# Patient Record
Sex: Male | Born: 1935 | Race: White | Hispanic: No | Marital: Married | Smoking: Former smoker
Health system: Southern US, Community
[De-identification: ages and names within clinical notes are randomized; demographics above are authoritative.]

## PROBLEM LIST (undated history)

## (undated) DIAGNOSIS — I509 Heart failure, unspecified: Secondary | ICD-10-CM

## (undated) DIAGNOSIS — I10 Essential (primary) hypertension: Secondary | ICD-10-CM

## (undated) DIAGNOSIS — I4891 Unspecified atrial fibrillation: Secondary | ICD-10-CM

## (undated) DIAGNOSIS — I714 Abdominal aortic aneurysm, without rupture, unspecified: Secondary | ICD-10-CM

## (undated) DIAGNOSIS — R0602 Shortness of breath: Secondary | ICD-10-CM

## (undated) DIAGNOSIS — M51379 Other intervertebral disc degeneration, lumbosacral region without mention of lumbar back pain or lower extremity pain: Secondary | ICD-10-CM

## (undated) DIAGNOSIS — E785 Hyperlipidemia, unspecified: Secondary | ICD-10-CM

## (undated) DIAGNOSIS — I5032 Chronic diastolic (congestive) heart failure: Secondary | ICD-10-CM

## (undated) DIAGNOSIS — N200 Calculus of kidney: Secondary | ICD-10-CM

## (undated) DIAGNOSIS — I447 Left bundle-branch block, unspecified: Secondary | ICD-10-CM

## (undated) DIAGNOSIS — J4489 Other specified chronic obstructive pulmonary disease: Secondary | ICD-10-CM

## (undated) DIAGNOSIS — E119 Type 2 diabetes mellitus without complications: Secondary | ICD-10-CM

## (undated) DIAGNOSIS — K219 Gastro-esophageal reflux disease without esophagitis: Secondary | ICD-10-CM

## (undated) DIAGNOSIS — M5137 Other intervertebral disc degeneration, lumbosacral region: Secondary | ICD-10-CM

## (undated) DIAGNOSIS — C801 Malignant (primary) neoplasm, unspecified: Secondary | ICD-10-CM

## (undated) DIAGNOSIS — Z85118 Personal history of other malignant neoplasm of bronchus and lung: Secondary | ICD-10-CM

## (undated) DIAGNOSIS — J449 Chronic obstructive pulmonary disease, unspecified: Secondary | ICD-10-CM

## (undated) DIAGNOSIS — I2510 Atherosclerotic heart disease of native coronary artery without angina pectoris: Secondary | ICD-10-CM

## (undated) DIAGNOSIS — Z95810 Presence of automatic (implantable) cardiac defibrillator: Secondary | ICD-10-CM

## (undated) DIAGNOSIS — I119 Hypertensive heart disease without heart failure: Secondary | ICD-10-CM

## (undated) HISTORY — PX: TONSILLECTOMY: SUR1361

## (undated) HISTORY — DX: Chronic obstructive pulmonary disease, unspecified: J44.9

## (undated) HISTORY — DX: Personal history of other malignant neoplasm of bronchus and lung: Z85.118

## (undated) HISTORY — DX: Malignant (primary) neoplasm, unspecified: C80.1

## (undated) HISTORY — PX: CATARACT EXTRACTION: SUR2

## (undated) HISTORY — DX: Gastro-esophageal reflux disease without esophagitis: K21.9

## (undated) HISTORY — DX: Hypertensive heart disease without heart failure: I11.9

## (undated) HISTORY — DX: Essential (primary) hypertension: I10

## (undated) HISTORY — DX: Type 2 diabetes mellitus without complications: E11.9

## (undated) HISTORY — DX: Other intervertebral disc degeneration, lumbosacral region: M51.37

## (undated) HISTORY — DX: Heart failure, unspecified: I50.9

## (undated) HISTORY — DX: Calculus of kidney: N20.0

## (undated) HISTORY — DX: Hyperlipidemia, unspecified: E78.5

## (undated) HISTORY — DX: Unspecified atrial fibrillation: I48.91

## (undated) HISTORY — DX: Abdominal aortic aneurysm, without rupture: I71.4

## (undated) HISTORY — DX: Shortness of breath: R06.02

## (undated) HISTORY — DX: Chronic diastolic (congestive) heart failure: I50.32

## (undated) HISTORY — DX: Atherosclerotic heart disease of native coronary artery without angina pectoris: I25.10

## (undated) HISTORY — DX: Abdominal aortic aneurysm, without rupture, unspecified: I71.40

## (undated) HISTORY — DX: Other specified chronic obstructive pulmonary disease: J44.89

## (undated) HISTORY — DX: Other intervertebral disc degeneration, lumbosacral region without mention of lumbar back pain or lower extremity pain: M51.379

---

## 2002-07-23 ENCOUNTER — Ambulatory Visit (HOSPITAL_COMMUNITY): Admission: RE | Admit: 2002-07-23 | Discharge: 2002-07-24 | Payer: Self-pay | Admitting: Cardiology

## 2005-11-12 DIAGNOSIS — C801 Malignant (primary) neoplasm, unspecified: Secondary | ICD-10-CM

## 2005-11-12 HISTORY — DX: Malignant (primary) neoplasm, unspecified: C80.1

## 2005-11-30 ENCOUNTER — Ambulatory Visit (HOSPITAL_COMMUNITY): Admission: RE | Admit: 2005-11-30 | Discharge: 2005-11-30 | Payer: Self-pay | Admitting: Optometry

## 2005-12-13 DIAGNOSIS — Z85118 Personal history of other malignant neoplasm of bronchus and lung: Secondary | ICD-10-CM

## 2005-12-13 HISTORY — DX: Personal history of other malignant neoplasm of bronchus and lung: Z85.118

## 2005-12-13 HISTORY — PX: OTHER SURGICAL HISTORY: SHX169

## 2005-12-20 ENCOUNTER — Encounter (INDEPENDENT_AMBULATORY_CARE_PROVIDER_SITE_OTHER): Payer: Self-pay | Admitting: Specialist

## 2005-12-20 ENCOUNTER — Inpatient Hospital Stay (HOSPITAL_COMMUNITY): Admission: RE | Admit: 2005-12-20 | Discharge: 2005-12-25 | Payer: Self-pay | Admitting: Thoracic Surgery

## 2006-01-01 ENCOUNTER — Encounter: Admission: RE | Admit: 2006-01-01 | Discharge: 2006-01-01 | Payer: Self-pay | Admitting: Thoracic Surgery

## 2006-01-15 ENCOUNTER — Encounter: Admission: RE | Admit: 2006-01-15 | Discharge: 2006-01-15 | Payer: Self-pay | Admitting: Thoracic Surgery

## 2006-04-02 ENCOUNTER — Ambulatory Visit: Payer: Self-pay | Admitting: Cardiology

## 2006-10-30 ENCOUNTER — Encounter: Admission: RE | Admit: 2006-10-30 | Discharge: 2006-10-30 | Payer: Self-pay | Admitting: Thoracic Surgery

## 2006-11-12 HISTORY — PX: COLONOSCOPY: SHX174

## 2007-03-12 ENCOUNTER — Ambulatory Visit: Payer: Self-pay | Admitting: Thoracic Surgery

## 2007-03-12 ENCOUNTER — Encounter: Admission: RE | Admit: 2007-03-12 | Discharge: 2007-03-12 | Payer: Self-pay | Admitting: Thoracic Surgery

## 2007-09-04 ENCOUNTER — Ambulatory Visit: Payer: Self-pay | Admitting: Thoracic Surgery

## 2007-12-24 ENCOUNTER — Encounter: Admission: RE | Admit: 2007-12-24 | Discharge: 2007-12-24 | Payer: Self-pay | Admitting: Thoracic Surgery

## 2007-12-24 ENCOUNTER — Ambulatory Visit: Payer: Self-pay | Admitting: Thoracic Surgery

## 2008-04-30 ENCOUNTER — Encounter: Admission: RE | Admit: 2008-04-30 | Discharge: 2008-04-30 | Payer: Self-pay | Admitting: Cardiology

## 2008-04-30 ENCOUNTER — Encounter: Payer: Self-pay | Admitting: Internal Medicine

## 2008-05-03 ENCOUNTER — Ambulatory Visit (HOSPITAL_COMMUNITY): Admission: RE | Admit: 2008-05-03 | Discharge: 2008-05-03 | Payer: Self-pay | Admitting: Cardiology

## 2008-05-11 ENCOUNTER — Ambulatory Visit: Payer: Self-pay | Admitting: Internal Medicine

## 2008-05-11 DIAGNOSIS — I359 Nonrheumatic aortic valve disorder, unspecified: Secondary | ICD-10-CM | POA: Insufficient documentation

## 2008-05-11 DIAGNOSIS — M5137 Other intervertebral disc degeneration, lumbosacral region: Secondary | ICD-10-CM

## 2008-05-11 DIAGNOSIS — J449 Chronic obstructive pulmonary disease, unspecified: Secondary | ICD-10-CM

## 2008-05-11 DIAGNOSIS — E785 Hyperlipidemia, unspecified: Secondary | ICD-10-CM

## 2008-05-11 DIAGNOSIS — R0602 Shortness of breath: Secondary | ICD-10-CM

## 2008-05-11 DIAGNOSIS — I119 Hypertensive heart disease without heart failure: Secondary | ICD-10-CM

## 2008-06-16 ENCOUNTER — Ambulatory Visit: Payer: Self-pay | Admitting: Internal Medicine

## 2008-06-25 ENCOUNTER — Ambulatory Visit: Payer: Self-pay | Admitting: Internal Medicine

## 2008-06-28 DIAGNOSIS — R05 Cough: Secondary | ICD-10-CM

## 2008-06-30 ENCOUNTER — Ambulatory Visit: Payer: Self-pay | Admitting: Thoracic Surgery

## 2008-06-30 ENCOUNTER — Ambulatory Visit: Payer: Self-pay | Admitting: Cardiology

## 2008-06-30 ENCOUNTER — Encounter: Admission: RE | Admit: 2008-06-30 | Discharge: 2008-06-30 | Payer: Self-pay | Admitting: Thoracic Surgery

## 2008-07-09 ENCOUNTER — Ambulatory Visit: Payer: Self-pay | Admitting: Internal Medicine

## 2008-07-09 LAB — CONVERTED CEMR LAB
Basophils Absolute: 0.1 10*3/uL (ref 0.0–0.1)
Basophils Relative: 1.4 % (ref 0.0–3.0)
CO2: 30 meq/L (ref 19–32)
Calcium: 9.6 mg/dL (ref 8.4–10.5)
Chloride: 102 meq/L (ref 96–112)
Eosinophils Relative: 3.4 % (ref 0.0–5.0)
GFR calc Af Amer: 70 mL/min
Glucose, Bld: 115 mg/dL — ABNORMAL HIGH (ref 70–99)
HCT: 44.4 % (ref 39.0–52.0)
Lymphocytes Relative: 29.5 % (ref 12.0–46.0)
Sodium: 139 meq/L (ref 135–145)
TSH: 2.61 microintl units/mL (ref 0.35–5.50)

## 2008-07-13 ENCOUNTER — Telehealth (INDEPENDENT_AMBULATORY_CARE_PROVIDER_SITE_OTHER): Payer: Self-pay | Admitting: *Deleted

## 2008-07-23 ENCOUNTER — Ambulatory Visit: Payer: Self-pay | Admitting: Internal Medicine

## 2008-07-29 ENCOUNTER — Encounter: Admission: RE | Admit: 2008-07-29 | Discharge: 2008-07-29 | Payer: Self-pay | Admitting: Otolaryngology

## 2008-07-29 ENCOUNTER — Encounter: Payer: Self-pay | Admitting: Gastroenterology

## 2008-08-06 ENCOUNTER — Ambulatory Visit: Payer: Self-pay | Admitting: Internal Medicine

## 2008-08-06 DIAGNOSIS — K219 Gastro-esophageal reflux disease without esophagitis: Secondary | ICD-10-CM

## 2008-08-06 HISTORY — DX: Gastro-esophageal reflux disease without esophagitis: K21.9

## 2008-08-11 ENCOUNTER — Telehealth: Payer: Self-pay | Admitting: Internal Medicine

## 2008-09-03 ENCOUNTER — Encounter: Payer: Self-pay | Admitting: Internal Medicine

## 2008-09-03 ENCOUNTER — Ambulatory Visit: Payer: Self-pay | Admitting: Gastroenterology

## 2008-09-03 ENCOUNTER — Ambulatory Visit (HOSPITAL_COMMUNITY): Admission: RE | Admit: 2008-09-03 | Discharge: 2008-09-03 | Payer: Self-pay | Admitting: Cardiology

## 2008-09-03 ENCOUNTER — Ambulatory Visit: Payer: Self-pay | Admitting: Internal Medicine

## 2008-09-07 ENCOUNTER — Ambulatory Visit: Payer: Self-pay | Admitting: Gastroenterology

## 2008-09-10 ENCOUNTER — Telehealth (INDEPENDENT_AMBULATORY_CARE_PROVIDER_SITE_OTHER): Payer: Self-pay | Admitting: *Deleted

## 2008-09-10 ENCOUNTER — Encounter: Payer: Self-pay | Admitting: Internal Medicine

## 2008-09-15 ENCOUNTER — Ambulatory Visit: Payer: Self-pay | Admitting: Internal Medicine

## 2008-09-30 ENCOUNTER — Ambulatory Visit: Payer: Self-pay | Admitting: Internal Medicine

## 2008-11-08 ENCOUNTER — Ambulatory Visit: Payer: Self-pay | Admitting: Internal Medicine

## 2008-11-30 ENCOUNTER — Encounter: Payer: Self-pay | Admitting: Internal Medicine

## 2009-01-05 ENCOUNTER — Ambulatory Visit: Payer: Self-pay | Admitting: Thoracic Surgery

## 2009-01-05 ENCOUNTER — Encounter: Admission: RE | Admit: 2009-01-05 | Discharge: 2009-01-05 | Payer: Self-pay | Admitting: Thoracic Surgery

## 2009-03-08 ENCOUNTER — Ambulatory Visit: Payer: Self-pay | Admitting: Internal Medicine

## 2009-05-03 DIAGNOSIS — N200 Calculus of kidney: Secondary | ICD-10-CM

## 2009-05-03 HISTORY — DX: Calculus of kidney: N20.0

## 2009-05-27 ENCOUNTER — Ambulatory Visit: Payer: Self-pay | Admitting: Vascular Surgery

## 2009-09-05 ENCOUNTER — Ambulatory Visit: Payer: Self-pay | Admitting: Internal Medicine

## 2009-12-07 ENCOUNTER — Ambulatory Visit: Payer: Self-pay | Admitting: Thoracic Surgery

## 2009-12-07 ENCOUNTER — Encounter: Admission: RE | Admit: 2009-12-07 | Discharge: 2009-12-07 | Payer: Self-pay | Admitting: Thoracic Surgery

## 2010-02-08 ENCOUNTER — Encounter: Admission: RE | Admit: 2010-02-08 | Discharge: 2010-02-08 | Payer: Self-pay | Admitting: Cardiology

## 2010-02-10 HISTORY — PX: TISSUE AORTIC VALVE REPLACEMENT: SHX2527

## 2010-02-10 HISTORY — PX: CORONARY ARTERY BYPASS GRAFT: SHX141

## 2010-02-16 ENCOUNTER — Encounter: Payer: Self-pay | Admitting: Internal Medicine

## 2010-02-16 ENCOUNTER — Ambulatory Visit: Payer: Self-pay | Admitting: Thoracic Surgery (Cardiothoracic Vascular Surgery)

## 2010-02-16 ENCOUNTER — Ambulatory Visit (HOSPITAL_COMMUNITY): Admission: RE | Admit: 2010-02-16 | Discharge: 2010-02-16 | Payer: Self-pay | Admitting: Cardiology

## 2010-02-20 DIAGNOSIS — Z952 Presence of prosthetic heart valve: Secondary | ICD-10-CM | POA: Insufficient documentation

## 2010-02-21 ENCOUNTER — Encounter: Admission: AD | Admit: 2010-02-21 | Discharge: 2010-02-21 | Payer: Self-pay | Admitting: Dentistry

## 2010-02-22 ENCOUNTER — Encounter: Payer: Self-pay | Admitting: Thoracic Surgery (Cardiothoracic Vascular Surgery)

## 2010-02-23 ENCOUNTER — Observation Stay (HOSPITAL_COMMUNITY): Admission: RE | Admit: 2010-02-23 | Discharge: 2010-02-24 | Payer: Self-pay | Admitting: Dentistry

## 2010-02-23 ENCOUNTER — Ambulatory Visit: Payer: Self-pay | Admitting: Dentistry

## 2010-02-28 ENCOUNTER — Encounter: Payer: Self-pay | Admitting: Thoracic Surgery (Cardiothoracic Vascular Surgery)

## 2010-02-28 ENCOUNTER — Inpatient Hospital Stay (HOSPITAL_COMMUNITY)
Admission: RE | Admit: 2010-02-28 | Discharge: 2010-03-09 | Payer: Self-pay | Admitting: Thoracic Surgery (Cardiothoracic Vascular Surgery)

## 2010-03-29 ENCOUNTER — Ambulatory Visit: Payer: Self-pay | Admitting: Thoracic Surgery (Cardiothoracic Vascular Surgery)

## 2010-03-29 ENCOUNTER — Encounter
Admission: RE | Admit: 2010-03-29 | Discharge: 2010-03-29 | Payer: Self-pay | Admitting: Thoracic Surgery (Cardiothoracic Vascular Surgery)

## 2010-05-17 ENCOUNTER — Ambulatory Visit: Payer: Self-pay | Admitting: Dentistry

## 2010-06-06 ENCOUNTER — Encounter: Admission: RE | Admit: 2010-06-06 | Discharge: 2010-06-06 | Payer: Self-pay | Admitting: Thoracic Surgery

## 2010-06-06 ENCOUNTER — Ambulatory Visit: Payer: Self-pay | Admitting: Thoracic Surgery

## 2010-06-23 ENCOUNTER — Ambulatory Visit: Payer: Self-pay | Admitting: Vascular Surgery

## 2010-12-03 ENCOUNTER — Encounter: Payer: Self-pay | Admitting: Thoracic Surgery

## 2010-12-05 ENCOUNTER — Ambulatory Visit: Admit: 2010-12-05 | Payer: Self-pay | Admitting: Thoracic Surgery

## 2010-12-13 ENCOUNTER — Encounter: Payer: Self-pay | Admitting: Thoracic Surgery

## 2010-12-14 NOTE — Consult Note (Signed)
Summary: [facility]  [facility]   Imported By: Lennie Odor 03/01/2010 14:14:03  _____________________________________________________________________  External Attachment:    Type:   Image     Comment:   External Document

## 2011-01-03 ENCOUNTER — Other Ambulatory Visit: Payer: Self-pay | Admitting: Thoracic Surgery

## 2011-01-03 DIAGNOSIS — R911 Solitary pulmonary nodule: Secondary | ICD-10-CM

## 2011-01-17 ENCOUNTER — Other Ambulatory Visit: Payer: Self-pay

## 2011-01-23 ENCOUNTER — Ambulatory Visit: Payer: Medicare Other | Admitting: Thoracic Surgery

## 2011-01-23 ENCOUNTER — Other Ambulatory Visit: Payer: Self-pay

## 2011-01-30 LAB — BASIC METABOLIC PANEL
BUN: 17 mg/dL (ref 6–23)
BUN: 20 mg/dL (ref 6–23)
BUN: 20 mg/dL (ref 6–23)
BUN: 22 mg/dL (ref 6–23)
BUN: 26 mg/dL — ABNORMAL HIGH (ref 6–23)
Calcium: 8.1 mg/dL — ABNORMAL LOW (ref 8.4–10.5)
Calcium: 8.6 mg/dL (ref 8.4–10.5)
Calcium: 8.8 mg/dL (ref 8.4–10.5)
Chloride: 101 mEq/L (ref 96–112)
Chloride: 104 mEq/L (ref 96–112)
Chloride: 99 mEq/L (ref 96–112)
Creatinine, Ser: 1.16 mg/dL (ref 0.4–1.5)
Creatinine, Ser: 1.17 mg/dL (ref 0.4–1.5)
Creatinine, Ser: 1.4 mg/dL (ref 0.4–1.5)
GFR calc Af Amer: >60 mL/min (ref >60)
GFR calc Af Amer: >60 mL/min (ref >60)
GFR calc Af Amer: >60 mL/min (ref >60)
GFR calc Af Amer: >60 mL/min (ref >60)
GFR calc non Af Amer: 50 mL/min — ABNORMAL LOW (ref >60)
GFR calc non Af Amer: 53 mL/min — ABNORMAL LOW (ref >60)
GFR calc non Af Amer: >60 mL/min (ref >60)
GFR calc non Af Amer: >60 mL/min (ref >60)
Glucose, Bld: 104 mg/dL — ABNORMAL HIGH (ref 70–99)
Glucose, Bld: 116 mg/dL — ABNORMAL HIGH (ref 70–99)
Potassium: 4 mEq/L (ref 3.5–5.1)
Potassium: 4.1 mEq/L (ref 3.5–5.1)
Potassium: 4.3 mEq/L (ref 3.5–5.1)
Sodium: 137 mEq/L (ref 135–145)
Sodium: 138 mEq/L (ref 135–145)

## 2011-01-30 LAB — POCT I-STAT 4, (NA,K, GLUC, HGB,HCT)
Glucose, Bld: 128 mg/dL — ABNORMAL HIGH (ref 70–99)
Glucose, Bld: 144 mg/dL — ABNORMAL HIGH (ref 70–99)
Glucose, Bld: 149 mg/dL — ABNORMAL HIGH (ref 70–99)
Glucose, Bld: 159 mg/dL — ABNORMAL HIGH (ref 70–99)
Glucose, Bld: 178 mg/dL — ABNORMAL HIGH (ref 70–99)
HCT: 16 % — ABNORMAL LOW (ref 39.0–52.0)
HCT: 18 % — ABNORMAL LOW (ref 39.0–52.0)
HCT: 21 % — ABNORMAL LOW (ref 39.0–52.0)
HCT: 24 % — ABNORMAL LOW (ref 39.0–52.0)
HCT: 25 % — ABNORMAL LOW (ref 39.0–52.0)
HCT: 33 % — ABNORMAL LOW (ref 39.0–52.0)
Hemoglobin: 10.5 g/dL — ABNORMAL LOW (ref 13.0–17.0)
Hemoglobin: 11.2 g/dL — ABNORMAL LOW (ref 13.0–17.0)
Hemoglobin: 5.4 g/dL — CL (ref 13.0–17.0)
Hemoglobin: 7.1 g/dL — ABNORMAL LOW (ref 13.0–17.0)
Hemoglobin: 8.2 g/dL — ABNORMAL LOW (ref 13.0–17.0)
Potassium: 3.7 mEq/L (ref 3.5–5.1)
Potassium: 3.9 mEq/L (ref 3.5–5.1)
Potassium: 4.2 mEq/L (ref 3.5–5.1)
Potassium: 4.2 mEq/L (ref 3.5–5.1)
Potassium: 4.6 mEq/L (ref 3.5–5.1)
Potassium: 5.3 mEq/L — ABNORMAL HIGH (ref 3.5–5.1)
Sodium: 131 mEq/L — ABNORMAL LOW (ref 135–145)
Sodium: 132 mEq/L — ABNORMAL LOW (ref 135–145)
Sodium: 135 mEq/L (ref 135–145)
Sodium: 136 mEq/L (ref 135–145)
Sodium: 138 mEq/L (ref 135–145)
Sodium: 139 mEq/L (ref 135–145)

## 2011-01-30 LAB — POCT I-STAT 3, ART BLOOD GAS (G3+)
Acid-base deficit: 1 mmol/L (ref 0.0–2.0)
Bicarbonate: 24.1 mEq/L — ABNORMAL HIGH (ref 20.0–24.0)
Bicarbonate: 24.7 mEq/L — ABNORMAL HIGH (ref 20.0–24.0)
Bicarbonate: 25.1 mEq/L — ABNORMAL HIGH (ref 20.0–24.0)
O2 Saturation: 89 %
O2 Saturation: 89 %
O2 Saturation: 95 %
Patient temperature: 35.9
Patient temperature: 36
Patient temperature: 37.9
Patient temperature: 38
TCO2: 25 mmol/L (ref 0–100)
pCO2 arterial: 42 mmHg (ref 35.0–45.0)
pCO2 arterial: 44.2 mmHg (ref 35.0–45.0)
pH, Arterial: 7.364 (ref 7.350–7.450)
pH, Arterial: 7.385 (ref 7.350–7.450)
pO2, Arterial: 54 mmHg — ABNORMAL LOW (ref 80.0–100.0)
pO2, Arterial: 59 mmHg — ABNORMAL LOW (ref 80.0–100.0)
pO2, Arterial: 83 mmHg (ref 80.0–100.0)

## 2011-01-30 LAB — PREPARE PLATELETS

## 2011-01-30 LAB — GLUCOSE, CAPILLARY
Glucose-Capillary: 101 mg/dL — ABNORMAL HIGH (ref 70–99)
Glucose-Capillary: 103 mg/dL — ABNORMAL HIGH (ref 70–99)
Glucose-Capillary: 111 mg/dL — ABNORMAL HIGH (ref 70–99)
Glucose-Capillary: 115 mg/dL — ABNORMAL HIGH (ref 70–99)
Glucose-Capillary: 119 mg/dL — ABNORMAL HIGH (ref 70–99)
Glucose-Capillary: 121 mg/dL — ABNORMAL HIGH (ref 70–99)
Glucose-Capillary: 121 mg/dL — ABNORMAL HIGH (ref 70–99)
Glucose-Capillary: 122 mg/dL — ABNORMAL HIGH (ref 70–99)
Glucose-Capillary: 138 mg/dL — ABNORMAL HIGH (ref 70–99)
Glucose-Capillary: 139 mg/dL — ABNORMAL HIGH (ref 70–99)
Glucose-Capillary: 142 mg/dL — ABNORMAL HIGH (ref 70–99)
Glucose-Capillary: 145 mg/dL — ABNORMAL HIGH (ref 70–99)
Glucose-Capillary: 161 mg/dL — ABNORMAL HIGH (ref 70–99)
Glucose-Capillary: 161 mg/dL — ABNORMAL HIGH (ref 70–99)
Glucose-Capillary: 162 mg/dL — ABNORMAL HIGH (ref 70–99)
Glucose-Capillary: 194 mg/dL — ABNORMAL HIGH (ref 70–99)
Glucose-Capillary: 78 mg/dL (ref 70–99)
Glucose-Capillary: 94 mg/dL (ref 70–99)

## 2011-01-30 LAB — CBC
HCT: 22 % — ABNORMAL LOW (ref 39.0–52.0)
HCT: 26.3 % — ABNORMAL LOW (ref 39.0–52.0)
HCT: 27.3 % — ABNORMAL LOW (ref 39.0–52.0)
HCT: 27.8 % — ABNORMAL LOW (ref 39.0–52.0)
HCT: 30.4 % — ABNORMAL LOW (ref 39.0–52.0)
Hemoglobin: 10.2 g/dL — ABNORMAL LOW (ref 13.0–17.0)
Hemoglobin: 7.9 g/dL — ABNORMAL LOW (ref 13.0–17.0)
Hemoglobin: 8.2 g/dL — ABNORMAL LOW (ref 13.0–17.0)
Hemoglobin: 9.6 g/dL — ABNORMAL LOW (ref 13.0–17.0)
MCHC: 35.6 g/dL (ref 30.0–36.0)
MCHC: 35.9 g/dL (ref 30.0–36.0)
MCV: 90.3 fL (ref 78.0–100.0)
MCV: 91.8 fL (ref 78.0–100.0)
MCV: 92.2 fL (ref 78.0–100.0)
MCV: 92.5 fL (ref 78.0–100.0)
Platelets: 106 10*3/uL — ABNORMAL LOW (ref 150–400)
Platelets: 116 10*3/uL — ABNORMAL LOW (ref 150–400)
Platelets: 123 10*3/uL — ABNORMAL LOW (ref 150–400)
Platelets: 132 10*3/uL — ABNORMAL LOW (ref 150–400)
Platelets: 134 10*3/uL — ABNORMAL LOW (ref 150–400)
Platelets: 138 10*3/uL — ABNORMAL LOW (ref 150–400)
Platelets: 84 10*3/uL — ABNORMAL LOW (ref 150–400)
RBC: 2.38 MIL/uL — ABNORMAL LOW (ref 4.22–5.81)
RBC: 2.86 MIL/uL — ABNORMAL LOW (ref 4.22–5.81)
RBC: 2.91 MIL/uL — ABNORMAL LOW (ref 4.22–5.81)
RBC: 3.08 MIL/uL — ABNORMAL LOW (ref 4.22–5.81)
RBC: 3.08 MIL/uL — ABNORMAL LOW (ref 4.22–5.81)
RDW: 12.6 % (ref 11.5–15.5)
RDW: 14.1 % (ref 11.5–15.5)
RDW: 14.5 % (ref 11.5–15.5)
RDW: 14.7 % (ref 11.5–15.5)
WBC: 10.1 10*3/uL (ref 4.0–10.5)
WBC: 10.6 10*3/uL — ABNORMAL HIGH (ref 4.0–10.5)
WBC: 11.4 10*3/uL — ABNORMAL HIGH (ref 4.0–10.5)
WBC: 12.2 10*3/uL — ABNORMAL HIGH (ref 4.0–10.5)
WBC: 7.2 10*3/uL (ref 4.0–10.5)

## 2011-01-30 LAB — HEMOGLOBIN AND HEMATOCRIT, BLOOD: HCT: 27 % — ABNORMAL LOW (ref 39.0–52.0)

## 2011-01-30 LAB — PREPARE FRESH FROZEN PLASMA

## 2011-01-30 LAB — PROTIME-INR
INR: 1.17 (ref 0.00–1.49)
INR: 1.19 (ref 0.00–1.49)
INR: 1.58 — ABNORMAL HIGH (ref 0.00–1.49)
INR: 1.59 — ABNORMAL HIGH (ref 0.00–1.49)
INR: 1.73 — ABNORMAL HIGH (ref 0.00–1.49)
Prothrombin Time: 14.8 seconds (ref 11.6–15.2)
Prothrombin Time: 15 seconds (ref 11.6–15.2)
Prothrombin Time: 15 seconds (ref 11.6–15.2)
Prothrombin Time: 15.8 seconds — ABNORMAL HIGH (ref 11.6–15.2)
Prothrombin Time: 16.8 seconds — ABNORMAL HIGH (ref 11.6–15.2)
Prothrombin Time: 18.7 seconds — ABNORMAL HIGH (ref 11.6–15.2)

## 2011-01-30 LAB — PREPARE RBC (CROSSMATCH)

## 2011-01-30 LAB — POCT I-STAT, CHEM 8
BUN: 24 mg/dL — ABNORMAL HIGH (ref 6–23)
Calcium, Ion: 1.12 mmol/L (ref 1.12–1.32)
Calcium, Ion: 1.13 mmol/L (ref 1.12–1.32)
Chloride: 100 mEq/L (ref 96–112)
Glucose, Bld: 129 mg/dL — ABNORMAL HIGH (ref 70–99)
HCT: 28 % — ABNORMAL LOW (ref 39.0–52.0)
HCT: 31 % — ABNORMAL LOW (ref 39.0–52.0)
Hemoglobin: 10.5 g/dL — ABNORMAL LOW (ref 13.0–17.0)
Potassium: 4.5 mEq/L (ref 3.5–5.1)
Sodium: 137 mEq/L (ref 135–145)

## 2011-01-30 LAB — EXPECTORATED SPUTUM ASSESSMENT W GRAM STAIN, RFLX TO RESP C

## 2011-01-30 LAB — LACTIC ACID, PLASMA: Lactic Acid, Venous: 3.7 mmol/L — ABNORMAL HIGH (ref 0.5–2.2)

## 2011-01-30 LAB — CREATININE, SERUM
Creatinine, Ser: 1.11 mg/dL (ref 0.4–1.5)
Creatinine, Ser: 1.46 mg/dL (ref 0.4–1.5)
GFR calc Af Amer: 58 mL/min — ABNORMAL LOW (ref >60)
GFR calc Af Amer: >60 mL/min (ref >60)
GFR calc non Af Amer: 48 mL/min — ABNORMAL LOW (ref >60)

## 2011-01-30 LAB — MAGNESIUM: Magnesium: 1.9 mg/dL (ref 1.5–2.5)

## 2011-01-30 LAB — CULTURE, RESPIRATORY W GRAM STAIN: Culture: NORMAL

## 2011-01-30 LAB — APTT: aPTT: 34 seconds (ref 24–37)

## 2011-01-31 LAB — POCT I-STAT 3, VENOUS BLOOD GAS (G3P V)
Acid-base deficit: 2 mmol/L (ref 0.0–2.0)
Bicarbonate: 24.5 mEq/L — ABNORMAL HIGH (ref 20.0–24.0)
O2 Saturation: 96 %
pCO2, Ven: 40.1 mmHg — ABNORMAL LOW (ref 45.0–50.0)

## 2011-01-31 LAB — TYPE AND SCREEN

## 2011-01-31 LAB — CBC
Platelets: 235 10*3/uL (ref 150–400)
WBC: 8.7 10*3/uL (ref 4.0–10.5)

## 2011-01-31 LAB — COMPREHENSIVE METABOLIC PANEL
AST: 27 U/L (ref 0–37)
Albumin: 4.2 g/dL (ref 3.5–5.2)
Alkaline Phosphatase: 75 U/L (ref 39–117)
Chloride: 104 mEq/L (ref 96–112)
GFR calc Af Amer: >60 mL/min (ref >60)
Potassium: 4 mEq/L (ref 3.5–5.1)
Total Bilirubin: 0.6 mg/dL (ref 0.3–1.2)

## 2011-01-31 LAB — URINE MICROSCOPIC-ADD ON

## 2011-01-31 LAB — BLOOD GAS, ARTERIAL
Drawn by: 206361
TCO2: 23.2 mmol/L (ref 0–100)
pCO2 arterial: 32.6 mmHg — ABNORMAL LOW (ref 35.0–45.0)
pH, Arterial: 7.447 (ref 7.350–7.450)

## 2011-01-31 LAB — URINALYSIS, ROUTINE W REFLEX MICROSCOPIC
Glucose, UA: NEGATIVE mg/dL
Leukocytes, UA: NEGATIVE
Protein, ur: NEGATIVE mg/dL
pH: 5 (ref 5.0–8.0)

## 2011-03-27 NOTE — Letter (Signed)
September 04, 2007   Lynett Fish, M.D.  [address] [place]  [place] [address]   Re:  ORSON, Kenneth [name]                 DOB:  May 05, 1936   Dear [name]:   I saw Mr. Rosenbloom back for followup today. His chest x-ray, you said,  showed no evidence of recurrence. He is 18 months since his surgery for  a stage  1-A non-small cell lung cancer.   PHYSICAL EXAMINATION:  VITAL SIGNS:  Blood pressure 180/100, pulse 64,  respiratory rate 18. Saturation was 95%.   PLAN:  I plan to see him back again in January and will repeat his CT  scan, which will be approximately the same time you see him, which will  be approximately 2 years since his surgery.   [name], M.D.  Electronically Signed   DPB/MEDQ  D:  09/04/2007  T:  09/05/2007  Job:  [id number]

## 2011-03-27 NOTE — Cardiovascular Report (Signed)
[name], Kenneth             ACCOUNT NO.:  [id number]   MEDICAL RECORD NO.:  [id number]          PATIENT TYPE:  OIB   LOCATION:  2899                         FACILITY:  [facility]   PHYSICIAN:  Georga Hacking, M.D.DATE OF BIRTH:  [birth year]   DATE OF PROCEDURE:  05/03/2008  DATE OF DISCHARGE:                            CARDIAC CATHETERIZATION   INDICATIONS:  The patient is a 74 year old male with known coronary  artery disease and previous stent who presents with worsening exertional  dyspnea, has had previous surgery for lung cancer.  He has what is  thought to be significant aortic valve disease and study is done to  assess coronary anatomy and aortic valve for stenosis.   PROCEDURE:  Right and left heart catheterization with coronary  angiograms and left ventriculogram.   PROCEDURE:  The patient was brought to the cath lab on the same day .  The right femoral vein was entered and a 7-French sheath was placed in  right femoral artery.  A right heart catheterization was performed and  ventriculograms were performed.  Following that the right femoral artery  was entered using a single anterior needle wall stick.  A pigtail was  then inserted through the sheath over a guide wire and right coronary  artery and the aortic valve was closed using a straight guidewire.  A 30-  mL ventriculogram was performed.  Pullback gradient was then measured.  Coronary angiograms were made using 6-French catheters and then a  pullback from the [place] was done.  He tolerated the procedure well and  the sheath was to be removed in the holding area.   HEMODYNAMIC DATA:  Aorta post LV injection 137/66, present saturation  94%.  Aorta post LV gram 156/6-15.  Right atrium A equals 7, V equals 9,  mean equals 5.  Right ventricle 33/4.  Pulmonary artery 33/14, mean  equals 21.  Pulmonary-capillary wedge pressure, A equals 14, V equals  19, and mean equals 14.  Cardiac output 4.44 liters per minute.   Aortic  valve gradient 17.  Aortic valve area 1.13 cm2.   ANGIOGRAPHIC DATA:  Left ventriculogram:  Performed in the 30-degree RAO  projection.  There is calcification involving the aortic valve, some  mild mitral annular calcification is noted.  Coronary arteries are  heavily calcified.  The left main coronary artery is calcified with mild  irregularity.  Left anterior descending is calcified with diffuse mild-  to-moderate proximal disease noted but no significant obstructive  stenoses are noted.  Circumflex coronary artery has an intermediate  branch and has a midvessel 50% stenosis.  The distal vessel circumflex  is somewhat small.  There is a mid 50-60% circumflex stenosis.  Right  coronary artery is heavily calcified.  There is a proximal 30% stenosis,  in the mid 40-50% stenosis, and in the distal 40% stenosis.  Artery has  a great deal of calcification present.   IMPRESSION:  1. Moderate-to-severe aortic stenosis.  2. Heavily calcified coronary arteries with diffuse coronary disease      but no severe focal obstructive stenoses are noted.  3. Mild elevation of  right heart pressures.   RECOMMENDATIONS:  Pulmonary evaluation.  If no other causes be found for  dyspnea could consider aortic valve replacement, but gradient is low at  this time.      Georga Hacking, M.D.  Electronically Signed     WST/MEDQ  D:  05/03/2008  T:  05/03/2008  Job:  [id number]   cc:   Doreen Beam, MD

## 2011-03-27 NOTE — Letter (Signed)
Mar 29, 2010   To Whom It May Concern.   Re:  [name], Kenneth             DOB:  [birth year]   Dear Milford Cage:   I am writing regarding the patient who recently was hospitalized at  [facility].  The patient is a 75 year old gentleman who  recently was admitted to [facility] for aortic valve replacement and  coronary bypass grafting.  Prior to his valve replacement, it was noted  that he had rather severe periodontal disease and due to the risk of  infection of the prosthetic valve in the setting, he needed dental  extractions.  He was seen in consultation by Dr. Cindra Eves who  confirmed that he did have periodontal disease and he underwent oral  surgery by Dr. Kristin Bruins on February 23, 2010, so that he would not be able  to undergo aortic valve replacement, which was done on February 28, 2010.  This was done for medical necessity in the setting of need for expedient  valve replacement and was medically necessary.  Please do not hesitate  to contact my office if I can be of any further assistance in this  matter.   Sincerely,   Salvatore Decent. Dorris Fetch, M.D.  Electronically Signed   SCH/MEDQ  D:  03/29/2010  T:  03/29/2010  Job:  [id number]

## 2011-03-27 NOTE — Assessment & Plan Note (Signed)
OFFICE VISIT   Kenneth [name], Kenneth [name]  DOB:  Apr 02, 1936                                        December 07, 2009  CHART #:  [id number]   The patient came today.  His blood pressure was 180/100, pulse 87,  respirations 18, sats were 96%.  Chest x-ray was stable.  There is no  evidence of recurrence of his cancer.  No supraclavicular or axillary  adenopathy.  We will see him back again 6 months with a CT scan.  He is  now 4 years since his surgery.   [name], M.D.  Electronically Signed   DPB/MEDQ  D:  12/07/2009  T:  12/07/2009  Job:  [id number]

## 2011-03-27 NOTE — Assessment & Plan Note (Signed)
OFFICE VISIT   Kenneth [name], Kenneth [name]  DOB:  1936/07/27                                        January 05, 2009  CHART #:  [id number]   The patient came for followup today.  He is 3 years since his surgery.  His blood pressure was 135/86, pulse 72, respirations 18, and sats were  97%.  Lungs were clear to auscultation and percussion and no  supraclavicular or axillary adenopathy.  CT scan showed no evidence of  recurrence.  I will see him back again in 6 weeks with a chest x-ray.   [name], M.D.  Electronically Signed   DPB/MEDQ  D:  01/05/2009  T:  01/05/2009  Job:  [id number]

## 2011-03-27 NOTE — Letter (Signed)
December 24, 2007   Lynett Fish, MD.  [address]  [place], [place], [address].   Re:  Kenneth [name], Kenneth [name]               DOB:  July 30, 1936   Dear [name]:   I saw Kenneth [name] back in the office today.  He is now two years since  his surgery.  His CT scan is negative.  There is no evidence of  recurrence of his cancer.  He has had a recent increase in his blood  pressure and was started on Norvasc and increased his Atenolol.  His  blood pressure was 160/80, pulse at 60, respirations 20, and SATs were  97%.  I will see him back in, in six months with a chest x-ray.   [name], M.D.  Electronically Signed   DPB/MEDQ  D:  12/24/2007  T:  12/25/2007  Job:  [id number]

## 2011-03-27 NOTE — Assessment & Plan Note (Signed)
OFFICE VISIT   Kenneth [name], Kenneth [name]  DOB:  1936-02-04                                        June 30, 2008  CHART #:  [id number]   The patient came for followup, two and half years since his surgery.  He  is having some shortness of breath.  His sats were 91%.  Chest x-ray  showed normal postoperative changes.  No evidence of recurrence.  His  blood pressure is 141/84, pulse 71, respirations 18, and sats were 91%  as mentioned.  I will see him again in 6 months with a CT scan, which  will be approximately 3 years.   [name], M.D.  Electronically Signed   DPB/MEDQ  D:  06/30/2008  T:  06/30/2008  Job:  [id number]

## 2011-03-27 NOTE — Procedures (Signed)
DUPLEX ULTRASOUND OF ABDOMINAL AORTA   INDICATION:  Followup abdominal aortic aneurysm.   HISTORY:  Diabetes:  No.  Cardiac:  Yes.  Hypertension:  Yes.  Smoking:  Previous.  Connective Tissue Disorder:  Family History:  No.  Previous Surgery:  No.   DUPLEX EXAM:         AP (cm)                   TRANSVERSE (cm)  Proximal             2.3 cm                    2.2 cm  Mid                  2.6 cm                    2.4 cm  Distal               4.0 cm                    4.0 cm  Right Iliac          1.0 cm                    1.1 cm  Left Iliac           1.0 cm                    1.0 cm   PREVIOUS:  Date:  AP:  4.1  TRANSVERSE:  4.1   IMPRESSION:  1. Abdominal aortic aneurysm with largest diameter of 4.0 x 4.0 cm.  2. Normal velocities noted in the aorta and bilateral common iliac      arteries.  3. Stable from previous CT done on 05/03/2009.   ___________________________________________  Larina Earthly, M.D.   NT/MEDQ  D:  06/23/2010  T:  06/23/2010  Job:  [id number]

## 2011-03-27 NOTE — Assessment & Plan Note (Signed)
OFFICE VISIT   [name], [name]  DOB:  07-22-1936                                        Mar 29, 2010  CHART #:  [id number]   REASON FOR VISIT:  Followup after recent aortic valve replacement and  coronary bypass grafting.   HISTORY OF PRESENT ILLNESS:  The patient is a 75 year old gentleman who  presented with exertional angina.  His known aortic stenosis had  progressed from moderate to severe and he had significant three-vessel  coronary disease.  He was admitted to the hospital.  He required dental  extractions prior to his valve replacement.  He then underwent coronary  bypass grafting and aortic valve replacement with a pericardial valve on  February 28, 2010.  Unfortunately, postoperatively, he had prolonged  bleeding and required a reexploration.  After that, his course was  really unremarkable.  He now returns for scheduled postoperative visit.  He saw Dr. Donnie Aho last week at which time, he says his blood pressure  had been running about 110-120 ever since discharge from the hospital.  Since he saw Dr. Donnie Aho, the following day and every day since then, his  blood pressures have been in the 150-160 range, systolic.  Other than  that, he feels well.  He does have some soreness, but he is anxious to  return to work and anxious to resume full activities.   CURRENT MEDICATIONS:  1. Aspirin 81 mg daily.  2. Crestor 10 mg daily.  3. Fexofenadine 180 mg daily p.r.n.  4. Guaifenesin 1200 mg b.i.d. p.r.n.  5. Dilaudid 2-4 mg p.r.n. for pain.  6. Combivent inhaler.  7. Toprol-XL 25 mg daily.  8. Coumadin 5 mg daily.   ALLERGIES:  He has no known allergies.   PHYSICAL EXAMINATION:  General:  The patient is a well-appearing 73-year-  old gentleman, in no acute distress.  Vital Signs:  His blood pressure  is 165/104, pulse 85, respirations are 18, and his oxygen saturation is  97% on room air.  Lungs:  Clear with equal breath sounds bilaterally.  Cardiac:  Regular rate and rhythm.  Normal S1 and S2.  There is no rub  or murmur.  Chest:  His sternum is stable.  Sternal incision is well  healed.  Extremities:  Leg incisions are well healed.  He has no  peripheral edema.   DIAGNOSTIC TESTS:  Chest x-ray shows good aeration of the lungs  bilaterally.   IMPRESSION:  The patient is doing extremely well.  Now, he is a month  out from aortic valve replacement and coronary bypass grafting  complicated by bleeding and necessitating reexploration of the  mediastinum.  At this point, I still do not want him to lift any objects  weighing greater than 10 pounds for at least another 2 weeks.  He may  begin driving.  Appropriate precautions were discussed.  He is very  anxious to return to work, and I told him he could slowly and gradually  do that over the next several weeks.   Regarding his blood pressure, since this has been pretty consistently  elevated over the last 5 or 6 days, I recommended to him that he restart  his Diovan he had been on prior to surgery, was held postoperatively due  to low blood pressure and then he will continue to follow his  blood  pressure on a daily basis.  He will continue to follow up with Dr. Sherril Croon  and Dr. Donnie Aho.  I would be happy to see him back anytime that I could  be of any further assistance with his care.   Kenneth [name], M.D.  Electronically Signed   SCH/MEDQ  D:  03/29/2010  T:  03/29/2010  Job:  [id number]   cc:   Kenneth [name], M.D.  Doreen Beam, MD

## 2011-03-27 NOTE — Consult Note (Signed)
NEW PATIENT CONSULTATION   Kenneth [name], Kenneth [name]  DOB:  10-22-1936                                       05/27/2009  BMWUX#:[id number]   The patient presents today for evaluation of infrarenal abdominal aortic  aneurysm.  He is a very pleasant, active 75 year old gentleman with a  known history of infrarenal abdominal aortic aneurysm.  He recently had  an episode of hematuria.  Does have a history of kidney stones and  underwent a CT at [facility].  This revealed an infrarenal  aneurysm of 4.1 cm.  I am seeing him for further discussion of this.  He  has no symptoms related to his aneurysm.   PAST MEDICAL HISTORY:  Significant for coronary artery disease with  prior myocardial infarction and also coronary artery stenting by Dr.  Donnie Aho in 2003.  He does have hypertension, elevated cholesterol, and  severe COPD.   FAMILY HISTORY:  Very positive for premature atherosclerotic disease  with heart attack in his father beginning at age 18 and also bypass in  brothers at age 20.   SOCIAL HISTORY:  He is married.  He is retired.  He does not smoke,  having quit in January 2007.  He does not drink alcohol on a regular  basis.   REVIEW OF SYSTEMS:  CONSTITUTIONAL:  His weight is reported at 176  pounds.  He is 5 feet 7 inches tall.  CARDIOVASCULAR:  He does have palpitations, shortness of breath with  exertion, heart murmur, and atrial fibrillation.  PULMONARY:  From pulmonary standpoint he does have wheezing, asthma,  bronchitis.  GASTROINTESTINAL:  Noted for esophageal reflux.  GENITOURINARY:  Kidney stones.  ORTHO:  Muscular pain.   CURRENT MEDICATIONS:  Aspirin, Plavix, Diovan, amlodipine, and  lovastatin.   DRUG ALLERGIES:  He reports no drug allergies.   PHYSICAL EXAM:  He is a well-developed, well-nourished white male  appearing stated age of 76.  He is grossly intact neurologically.  He is  in no acute distress.  His radial, femoral, and posterior  tibial pulses  are 2+ bilaterally.  Abdominal exam reveals prominent pulsation without  tenderness.  Heart is regular rate and rhythm.  Chest is clear  bilaterally.   I reviewed his CT scan from recent at [facility] and also prior  study from 2005.  In 2005 by my evaluation, maximal size of his aorta  was 3.3 cm and is now up to 4.1 cm.  I explained to the patient the risk  of continued growth and the eventual risk for rupture.  I explained that  he does not need to do any limitations of his usual activities aside  from control of blood pressure and have recommended that we see him  again in 1 year with repeat ultrasound at that time.  I did describe  symptoms of leaking aneurysm to him and he knows to report immediately  to the emergency room should this occur, but explained that this would  be extremely unlikely with the small caliber of aneurysm that he  currently has.  We will see him again in 1 year for continued  discussion.   Larina Earthly, M.D.  Electronically Signed   TFE/MEDQ  D:  05/27/2009  T:  05/30/2009  Job:  [id number]   cc:   Doreen Beam, MD  Georga Hacking,  M.D.  [name], M.D.  Clinton D. Maple Hudson, MD, FCCP, FACP

## 2011-03-27 NOTE — Letter (Signed)
June 06, 2010   Lynett Fish, MD  [address] [place], [place] [address]   Re:  Kenneth [name], Kenneth [name]             DOB:  01-12-1936   Dear [name],   I saw the patient back today.  He is now over 4-1/2 years since we did a  surgery with a left upper lobectomy.  The CT scan shows no evidence of  recurrence of his cancer.  Since we are a little off schedule on his CT  scans, we will get another one in another 6 months, which will be  approximately 5 years since his surgery and then I will release him back  to his medical doctor.  He is also doing fine from the standpoint of his  heart surgery that was done by Dr. Dorris Fetch.  His blood pressure was  136/85, pulse 62, respirations 18, and sats were 98%.   [name], M.D.  Electronically Signed   DPB/MEDQ  D:  06/06/2010  T:  06/07/2010  Job:  [id number]   cc:   Ocie Cornfield C. Dorris Fetch, M.D.

## 2011-03-30 NOTE — H&P (Signed)
Kenneth [name], Kenneth [name]             ACCOUNT NO.:  [id number]   MEDICAL RECORD NO.:  [id number]          PATIENT TYPE:  INP   LOCATION:  NA                           FACILITY:  [facility]   PHYSICIAN:  [name], M.D. DATE OF BIRTH:  [birth year]   DATE OF ADMISSION:  DATE OF DISCHARGE:                                HISTORY & PHYSICAL   CHIEF COMPLAINT:  Lung mass.   HISTORY OF PRESENT ILLNESS:  This 75 year old patient has a long history of  smoking and developed pneumonia. He was treated by his medical doctor, Dr.  Sherril Croon, for pneumonia, but he had an episode of hemoptysis and was referred to  Dr. Orson Aloe who did a bronchoscopy and CT scan. The CT scan showed a 2 to  3 lesion in the left upper lobe with post obstructive anterior pneumonitis.  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  PET scan was  positive in this left upper lobe but negative nodes.  Pulmonary function  tests showed FVC of 3.06 with an FEV1 of 1.78, and he was 64% of predicted.  He was told to try to quit smoking.  He has had bronchitis.  No recent fever  or chills.   PAST MEDICAL HISTORY:  Significant for:  1.  Hypertension.  2.  Heart disease.   ALLERGIES:  None.   MEDICATIONS:  1.  Plavix 75 mg a day which was stopped.  2.  Pravachol 80 mg a day.  3.  Diovan 160 mg a day.  4.  Atenolol 25 mg a day.  5.  Aspirin.   He has had cardiac catheterization with stents placed, the last one 4 years  ago.  No recent angina.  Dr. Donnie Aho was his cardiologist.  He has also been  treated for emphysema and hypercholesterolemia.   FAMILY HISTORY:  Positive for vascular disease.   SOCIAL HISTORY:  He is married.  Smokes 1 pack a day and is trying to quit.  He does not drink alcohol on a regular basis.  He has 2 children.   REVIEW OF SYSTEMS:  His weight has been stable.  He is 155 pounds, 5 feet 7  inches. CARDIAC: He was told he had a small cardiac murmur.  No atrial  fibrillation. PULMONARY: See  History of Present Illness.  GI: Had some  reflux.  No nausea, vomiting, constipation, or diarrhea.  GU: No dysuria or  frequent urination.  VASCULAR: No claudication, DVT, or TIAs. NEUROLOGIC: No  headaches, blackouts, or seizures. ORTHOPEDIC: He has degenerative  arthritis. PSYCHIATRIC: No history of depression or nervousness.  EYES AND  ENT: No changes in eyesight or hearing.  HEMATOLOGIC:  No problems with  bleeding or anemia.   PHYSICAL EXAMINATION:  VITAL SIGNS: Blood pressure 140/60, pulse 80,  respirations 18, O2 saturation 94%.  HEAD, EYES, EARS, NOSE, AND THROAT:  Head is atraumatic.  Eyes: Pupils  equal, round, and reactive to light and accommodation.  Extraocular  movements are normal. Ears: Tympanic membranes are intact.  Nose: No septal  deviation.  Mouth is without lesion.  NECK:  Supple.  There is no thyromegaly or carotid bruits. No  supraclavicular or axillary adenopathy.  CHEST: Decreased breath sounds but no wheezes or rales.  HEART: Regular sinus rhythm. I did not hear a murmur.  ABDOMEN:  Soft.  There is no hepatosplenomegaly.  EXTREMITIES:  Pulses are 2+.  There is no clubbing or edema.  NEUROLOGIC: Oriented x3.  Cranial nerves II-XII intact.  Sensory and motor  are intact.  SKIN: Without lesions.   IMPRESSION:  1.  Non-small-cell cancer, clinically stage 1A or 1B of left upper lobe.  2.  Hypertension.  3.  Chronic obstructive pulmonary disease.  4.  Coronary artery disease.  5.  Degenerative arthritis.  6.  History of reflux.   PLAN:  Resection of the left upper lobe.           ______________________________  [name], M.D.     DPB/MEDQ  D:  12/18/2005  T:  12/18/2005  Job:  [id number]

## 2011-03-30 NOTE — Discharge Summary (Signed)
Kenneth [name], Kenneth [name]             ACCOUNT NO.:  [id number]   MEDICAL RECORD NO.:  [id number]          PATIENT TYPE:  INP   LOCATION:  2010                         FACILITY:  [facility]   PHYSICIAN:  [name], M.D. DATE OF BIRTH:  1936/11/01   DATE OF ADMISSION:  12/20/2005  DATE OF DISCHARGE:  12/25/2005                                 DISCHARGE SUMMARY   PRIMARY DIAGNOSIS:  Non-small cell lung cancer, left upper lobe, with  postobstructive pneumonitis.   SECONDARY DIAGNOSES:  1.  Hypertension.  2.  History of coronary artery disease.   OPERATIONS/PROCEDURES:  Left thoracotomy, left upper lobectomy with node  dissection.   HISTORY OF PRESENT ILLNESS AND HOSPITAL COURSE:  The patient is a 75-year-  old male with a long history of smoking.  The patient recently developed  pneumonia and was treated by Dr. Sherril Croon.  The patient subsequently had an  episode of hemoptysis and was referred to Dr. Dorris Fetch who did a  bronchoscopy and CT scan.  CT scan showed a 2-3 lesion in the left upper  lobe with postobstructive anterior pneumonitis.  Bronchoscopy revealed  squamous cell cancer on cytology.  PET scan was positive in the left upper  lobe with negative nodes.  PFTs were done which showed an FVC of 3.06, with  an FEV1 of 1.78, and he was 64% predicted.  The patient was seen and  evaluated by Dr. Edwyna Shell.  Dr. Edwyna Shell discussed the patient undergoing  thoracotomy with left upper lobectomy.  He discussed the risks and benefits  of this procedure.  The patient acknowledged his understanding and agreed to  proceed.  Surgery was scheduled for December 20, 2005.   The patient was taken to the operating room on December 20, 2005 where he  underwent a thoracotomy with left upper lobectomy with node dissection.  The  patient tolerated this procedure well and was transferred up to the  intensive care unit in stable condition.  Pathology report came back  positive for invasive moderately  differentiated squamous cell carcinoma with  T1, N0, Mx.   The patient's postoperative course was pretty much unremarkable.  The  posterior chest tube was discontinued on postoperative day #1.  Followup  chest x-ray did show a small apical space.  This remained stable.  No air  leak noted, minimal drainage.  The remaining chest tube was discontinued on  postoperative day #4.  Followup chest x-ray was stable.   Postoperatively, the patient was out of bed and ambulating well.  He was  able to be weaned off oxygen, saturating greater than 90% on room air.  CT  scan of the brain was ordered on December 24, 2005, and the results are  currently still pending.  The patient's incisions are dry and intact and  healing well.  He is tolerating diet well with no nausea or vomiting noted.  The patient was well-controlled with pain regimen.  The patient was afebrile  during his postoperative course, and vital signs were stable.  He was  started back on his preoperative medications.  The patient was transferred  down to 2000  on postoperative day #4.   The patient is tentatively ready for discharge home on postoperative day #5,  December 25, 2005.   FOLLOW UP:  A followup appointment has been scheduled with Dr. Edwyna Shell for  January 01, 2006 at 3:50 p.m.  The patient is to obtain a PA and lateral  chest x-ray one hour prior to this appointment.   DISCHARGE INSTRUCTIONS:  Kenneth [name] received instructions on diet,  activity level, and incisional care.  He was told no driving until released  to do so, no heavy lifting over 10 pounds.  The patient was told he is  allowed to shower, washing his incisions using soap and water.  He is to  contact the office if he develops any drainage or opening from any of his  incision sites.  The patient was told to ambulate three to four times per  day, progress as tolerated, and to continue his breathing exercises.  He was  educated on diet to be low fat, low salt.   He is acknowledged his  understanding.   DISCHARGE MEDICATIONS:  1.  Tylox one to two tablets q.4-6h. p.r.n. pain.  2.  Plavix 75 mg daily.  3.  Pravachol 80 mg at night.  4.  Diovan 160 mg daily.  5.  Atenolol 25 mg daily.  6.  Aspirin 325 mg daily.  7.  Mucinex DM as used at home.  8.  Nitroglycerin as used at home.  9.  Avelox 500 mg daily x3 days.      Theda Belfast, PA    ______________________________  [name], M.D.    KMD/MEDQ  D:  12/24/2005  T:  12/25/2005  Job:  [id number]   cc:   [name], M.D.  [address]  [place]  [place] [address]

## 2011-03-30 NOTE — Discharge Summary (Signed)
NAMEPHENIX, VANDERMEULEN NO.:  [id number]   MEDICAL RECORD NO.:  [id number]                   PATIENT TYPE:  OIB   LOCATION:  6523                                 FACILITY:  [facility]   PHYSICIAN:  W. Ashley Royalty., M.D.         DATE OF BIRTH:  [birth year]   DATE OF ADMISSION:  07/23/2002  DATE OF DISCHARGE:                                 DISCHARGE SUMMARY   FINAL DIAGNOSES:  1. Unstable angina pectoris.     a. Severe stenosis in distal right coronary artery treated with a drug        alluding stent 2.5 x 18 mm this admission.  2. Hypertension.  3. Hyperlipidemia under treatment.  4. Cigarette abuse.   PROCEDURES:  Cardiac catheterization, angioplasty, and stenting of the  distal right coronary with angioplasty of the posterior descending artery.   HISTORY OF PRESENT ILLNESS:  This 75 year old male had a previous  angioplasty in 1996 to the mid-right coronary artery.  He then presented  with increasing dyspnea over the past several weeks and was seen at Dr.  Jackolyn Confer office.  He was seen at our office and scheduled directly for  catheterization because of his progressive symptoms.  Please see the  previously dictated history and physical for the remainder of the details.   HOSPITAL COURSE:  Lab data performed pre-admission showed a hemoglobin of  15.8, hematocrit of 43.3, sodium 138, potassium 5.5, chloride 103, CO2 29,  BUN 16, creatinine 1.1, glucose 108, PT and PTT normal.  EKG was normal  preoperatively except for PVCs.  He came in for same day cardiac  catheterization.  Left ventricle was normal.  The left anterior descending  had scattered irregularities as well as the circumflex.  The right coronary  artery had a mid-vessel 40 percent stenosis in the site of the previous  angioplasty.  There was a new severe distal 90-95 percent stenosis prior to  the posterior descending artery.  This ws treated with a 2.5 x 18 mm Cypher  drug alluding  stent.  He developed a stenosis of the posterior descending  artery following stent deployment.  It was crossed through the stent wire  and dilated with a 2.0 balloon.  He had a good angiographic result.  Following sheath removal he had an episode of hypotension following the  administration of Altace, and it was vagal reaction requiring Atropine and  IV fluids but was fine the next day.  He received counseling on smoking  cessation and the importance of regular exercise.  He was discharged today  in improved condition on aspirin 325 mg and Plavix 75 mg daily, atenolol 50  mg daily, Zocor 20 mg daily, nitroglycerine 1:150 sublingual p.r.n.  He had  a blood pressure of less than 130/80.  He was instructed not to smoke, is to  walk daily,  and to call if there are problems.  Darden Palmer., M.D.    WST/MEDQ  D:  07/24/2002  T:  07/26/2002  Job:  [id number]

## 2011-03-30 NOTE — H&P (Signed)
[name], [name] NO.:  [id number]   MEDICAL RECORD NO.:  [id number]                   PATIENT TYPE:  OIB   LOCATION:  2853                                 FACILITY:  [facility]   PHYSICIAN:  W. Ashley Royalty., M.D.         DATE OF BIRTH:  [birth year]   DATE OF ADMISSION:  07/23/2002  DATE OF DISCHARGE:                                HISTORY & PHYSICAL   HISTORY OF PRESENT ILLNESS:  The patient is a 75 year old male admitted for  cardiac catheterization.  The patient has a history of angina and in March  1996 had a tight stenosis involving the right coronary artery which was  treated with angioplasty in July because of restenosis.  He had recurrent  stenosis and had repeat angioplasty done at that time.  He has remained  asymptomatic since that time.  He did not have significant ST depression on  postoperative treadmill testing and had felt well.  He has been maintained  on lipid-lowering therapy.  He has had worsening dyspnea on exertion over  the past year but recently had progressive shortness of breath with  exertion.  Last weekend, he was pressure cleaning with bleach and noted  increasing shortness of breath yesterday with any level of activity.  He  complained of a burning feeling across his upper chest when he would go up  and down stairs and a progressive level of exercise intolerance.  He was  seen in Dr. Jackolyn Confer office yesterday and is admitted for catheterization at  this time to exclude progressive coronary disease.  He has complained of  feeling bad, recently he has been under a significant amount of situational  stress both financially and with a son who is causing some problems at home.   PAST MEDICAL HISTORY:  His past history is remarkable for cigarette abuse.  There is no previous history of hypertension or diabetes.   PREVIOUS SURGERY:  Tonsillectomy and cystoscopy.   ALLERGIES:  PENICILLIN.   CURRENT MEDICATIONS:  1.  Atenolol 25 mg daily.  2. Zocor 40 daily.  3. Aspirin daily.   FAMILY HISTORY:  Positive for heart disease.  Father died age 61 of heart  disease and had bypass at age 32.  Brother had had previous bypass.   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  He has done several other businesses and quit working for  [employer] after he injured his back.  He is not on disability; however.  He smokes 1-1/2 packs cigarettes per day; no alcohol.  Lives at home with  his second wife; they have nine children between the two of them.   REVIEW OF SYSTEMS:  No claudication.  He does complain of aching and hurting  all over.  He has had significant hematuria in the past.   PHYSICAL EXAMINATION:  GENERAL: Pleasant male in no acute distress.  VITAL SIGNS:  Weight 155, blood pressure 118/72 sitting, 114/70 standing,  pulse 60.  ENT: No JVD, thyromegaly, masses, or bruits.  LUNGS: Clear to A&P.  CARDIAC: Normal S1 and S2; no S3.  ABDOMEN: Soft, nontender.  Soft bilateral femoral bruits.  PULSES: Pulses 2+ peripherally in the posterior tibials, 1+ dorsalis pedis.   DIAGNOSTIC STUDIES:  A 12-lead ECG was normal.   IMPRESSION:  1. Chest discomfort suggestive of ischemia occurring at a low level with     changes in symptoms since previous visit.  2. Ongoing cigarette abuse.  3. Malaise and fatigue of uncertain cause.  4. Situational stress.   RECOMMENDATIONS:  Brought in for same-day cardiac catheterization, possibly  angioplasty or stent.  Procedure discussed with the patient fully including  the risks and he is agreeable and willing to proceed.  Discussed risks of  MI, death, CVA, emergency surgery, bleeding, or arrythmia with him.                                               Darden Palmer., M.D.    WST/MEDQ  D:  07/22/2002  T:  07/23/2002  Job:  [id number]   cc:   Wyvonnia Lora

## 2011-03-30 NOTE — Op Note (Signed)
Kenneth [name], Kenneth [name]             ACCOUNT NO.:  [id number]   MEDICAL RECORD NO.:  [id number]          PATIENT TYPE:  INP   LOCATION:  2550                         FACILITY:  [facility]   PHYSICIAN:  [name], M.D. DATE OF BIRTH:  1936-10-26   DATE OF PROCEDURE:  12/20/2005  DATE OF DISCHARGE:                                 OPERATIVE REPORT   PREOPERATIVE DIAGNOSIS:  Non-small-cell lung cancer, left upper lobe with  post obstructive pneumonitis.   POSTOPERATIVE DIAGNOSIS:  Non-small-cell lung cancer, left upper lobe with  post obstructive pneumonitis.   OPERATION/PROCEDURE:  1.  Left thoracotomy.  2.  Left upper lobectomy with node dissection.   SURGEON:  [name], M.D.   ASSISTANT:  __________ .   DESCRIPTION OF PROCEDURE:  After percutaneous insertion of all monitor  lines, the patient underwent general anesthesia and turned into the left  lateral thoracotomy position. Dual-lumen tube was inserted.  The trocar  sites were made in the anterior and mid axillary line at the seventh and  eighth intercostal space. Two trocars were inserted.  The 0-degree scope was  inserted.  There were adhesions of the left upper lobe.  The adhesions were  taken down after posterolateral thoracotomy.  With the latissimus being  partially divided and serratus reflected anterior.  After the adhesions had  been taken down, the lesion was palpated in the distal proximal margin of  the lung.  It was about a 2 cm lesion, peribronchial.  Dissection was  started around the superior pulmonary vein.  It was dissected out and looped  with the vascular tape.  Then the pulmonary artery was dissected out and  looped with a vascular tape. Attention was then started in the fissure and  the inferior portion of fissure was divided with an Auto-Suture stapler and  dissection was carried out.  We dissected out the lingular arteries, stapled  and divided with the Auto-Suture stapler.  We went more  superiorly and then  divided the anterior branch with the Auto-Suture stapler. This left the  apical posterior branch.  A 11-L and a 10-L node were dissected free.  The  apical posterior branch was then stable and divided with the Auto-Suture  stapler.  After this has been done, the bronchus was dissected out, then  stapled with a TL-30 stapler but it appeared that the bronchial margins were  closed so we restapled it with the TX-30 stapler and submitted.  Segmented  bronchial margins were negative on frozen section and also the lesion was a  non-small-cell squamous cell carcinoma.  Inferior pulmonary ligament was  taken down.  CoSeal was applied to the staple line.  No air leak was shown  under water.  Two chest tubes were brought into the trocar sites and sutured  in place with 0  silk.  The Marcaine block was done in the usual fashion and the On-Q  catheter was inserted.  Chest was closed with four pericostals of #1 Vicryl,  the muscularis with 2-0 Vicryl and subcutaneous tissue with 3-0 Vicryl  subcuticular.  The patient was returned to the recovery  room in stable  condition.           ______________________________  [name], M.D.     DPB/MEDQ  D:  12/20/2005  T:  12/20/2005  Job:  [id number]   cc:   Murriel Hopper, M.D.  [place], [place]

## 2011-03-30 NOTE — Cardiovascular Report (Signed)
[name], [name] NO.:  [id number]   MEDICAL RECORD NO.:  [id number]                   PATIENT TYPE:  OIB   LOCATION:  2853                                 FACILITY:  [facility]   PHYSICIAN:  W. Ashley Royalty., M.D.         DATE OF BIRTH:  21-Mar-1936   DATE OF PROCEDURE:  07/23/2002  DATE OF DISCHARGE:                              CARDIAC CATHETERIZATION   HISTORY OF PRESENT ILLNESS:  The patient is a 75 year old male with a  history of coronary artery disease and previous angioplasty of the mid LAD  in 1996. He presents with increasing shortness of breath and chest pain and  was brought in for catheterization.   DESCRIPTION OF PROCEDURE:  The patient was brought to the catheterization  and was prepped and draped in the usual manner. After Xylocaine anesthesia,  a 6 French sheath was placed in the right femoral artery percutaneously.  Angiogram was submitted using a 6 French catheters and a 25 cc  ventriculogram was performed. He was found to have a severe stenosis in the  distal right coronary artery.  Arrangements were made to do angioplasty. The  sheath was exchanged for a 7 [place] sheath.  He was administered 4000 units  of heparin receiving an ACT of 277.  Integrilin was begun with double bolus  in constant infusion and IV nitroglycerin was begun.  The angioplasty  equipment used was a 7 [place] guiding catheter with side holes. There  was some distal iliac atherosclerosis necessitating a wire exchange  technique.  A Hi-Torque Floppy guide wire crossed the distal lesion with  some difficulty. It was pre-dilatated with a 2.5 x 15 mm CrossSail balloon.  A 2.5 x 18 mm Cypher drug-eluting stent was placed and with great difficulty  advanced to across the lesion. It was deployed at 14 atmospheres. There was  some stenosis involving the origin of the posterior descending artery which  was jailed after that.  A 0.014 luge guide wire was placed  across this  posterior descending lesion with some difficulty. Initially, I tried to  place a 2.5 CrossSail across this which resulted in wire prolapse out of  this vessel. I then re-crossed the lesion and went across with a 2.0 x 15 mm  [place] and dilated this to 8 atmospheres.  Following this, the luge guide  wire was removed. I initially tried to place a 9 mm 2.75 [place] balloon down but  this could not be advance and I removed this and then placed a 12 mm Quantum  balloon down, which crossed into the stent and the stent was post-dilated to  15 atmospheres. He tolerated the procedure fairly well.  He was given an  additional 1000 units of heparin during the procedure and his nitroglycerin  had to be stopped and a fluid bolus was given mid case, because of some low  blood pressure. He eventually tolerated the procedure well.  HEMODYNAMIC DATA:  Aorta post-contrast 169/71, LV post-contrast 169/15.   ANGIOGRAPHIC DATA:   LEFT VENTRICULOGRAM:  Left ventriculogram performed in the 30-degree RAO  projection. The aortic valve was normal. The mitral valve was normal. The  left ventricle appears normal in size.  The estimated ejection fraction is  60%.  There is mitral annular calcification noted. Coronary arteries arise  and distribute normally. There is calcification in the proximal left  coronary system.   The left main coronary artery has mild diffuse narrowing.   Left anterior descending has calcification and mild diffuse narrowing but  severe focal obstructive stenoses are noted.   The circumflex coronary artery has two marginal branches, which appear  largely free of disease.   The right coronary artery is a dominant vessel with the posterior descending  and two posterolateral branches. The previous angioplasty site of the  midportion of the vessel was patent with less than 40% residual stenosis.  The distal posterior descending has a new 90% segmental stenosis with some   calcification which involves the origin of the posterior descending, but the  posterior descending is not stenosed pre-dilatation. Post-dilatation  angiograms show improvement in the stenosis to 0% distally with no  significant focal obstructive stenoses noted at the site of the stent and  preservation of all side branches.  There is a 30% stenosis involving the  origin of the PD following the procedure.   IMPRESSION:  1. Successful stenting of the right coronary artery with a drug-eluting     stent, stenosis going from 90-95% to 0%.  2. Successful percutaneous transluminal coronary angioplasty of the     posterior descending artery, post stent deployment.  3. Preserved left ventricular function with mild disease involving left     coronary system.                                                  Kenneth [name]., M.D.    WST/MEDQ  D:  07/23/2002  T:  07/24/2002  Job:  [id number]   cc:   Wyvonnia Lora  [address]  [place]  [place] [address]  Fax: [phone]

## 2011-08-09 LAB — POCT I-STAT 3, ART BLOOD GAS (G3+)
Bicarbonate: 21.3
O2 Saturation: 94
Operator id: 256671
TCO2: 25
pCO2 arterial: 37.6
pH, Arterial: 7.314 — ABNORMAL LOW
pH, Arterial: 7.415
pO2, Arterial: 32 — CL
pO2, Arterial: 70 — ABNORMAL LOW

## 2012-03-11 ENCOUNTER — Encounter: Payer: Self-pay | Admitting: *Deleted

## 2012-03-11 DIAGNOSIS — N200 Calculus of kidney: Secondary | ICD-10-CM | POA: Insufficient documentation

## 2012-03-11 DIAGNOSIS — I714 Abdominal aortic aneurysm, without rupture: Secondary | ICD-10-CM

## 2012-03-12 ENCOUNTER — Ambulatory Visit (INDEPENDENT_AMBULATORY_CARE_PROVIDER_SITE_OTHER): Payer: Medicare Other | Admitting: Thoracic Surgery

## 2012-03-12 ENCOUNTER — Encounter: Payer: Self-pay | Admitting: Thoracic Surgery

## 2012-03-12 VITALS — BP 160/90 | HR 63 | Resp 20 | Ht 67.0 in | Wt 164.0 lb

## 2012-03-12 DIAGNOSIS — R222 Localized swelling, mass and lump, trunk: Secondary | ICD-10-CM

## 2012-03-12 DIAGNOSIS — R918 Other nonspecific abnormal finding of lung field: Secondary | ICD-10-CM

## 2012-03-12 NOTE — Progress Notes (Signed)
HPI the patient returns for follow up today. He had an episode of hemoptysis after a sinus infection. This was treated with amoxicillin. Chest x-ray showed an inflammatory process in the right upper lobe CT scan was done that also showed the same there is no other evidence of adenopathy or other evidence of cancer. The hemoptysis has stopped. I reviewed the x-rays and feel this is probably inflammatory process. Bilateral treated with another 10 days amoxicillin 500 mg orally 3 times a day. We'll see him back in 3 weeks with a chest X.   Current Outpatient Prescriptions  Medication Sig Dispense Refill  . aspirin 81 MG tablet Take by mouth daily.      . fexofenadine (ALLEGRA) 180 MG tablet Take 180 mg by mouth daily.      . nebivolol (BYSTOLIC) 5 MG tablet Take 5 mg by mouth daily.      . rosuvastatin (CRESTOR) 10 MG tablet Take 10 mg by mouth daily.      . Testosterone (AXIRON) 30 MG/ACT SOLN Place onto the skin.      . valsartan-hydrochlorothiazide (DIOVAN-HCT) 320-25 MG per tablet Take 1 tablet by mouth daily.         Review of Systems: Hemoptysis  Physical Exam lungs are clear attestation   Diagnostic Tests: Chest x-ray shows a right upper lobe inflammatory infiltrate   Impression: Status post left upper lobectomy for non-small cell lung cancer status post coronary bypass and aortic valve replacement   Plan: Return in 3 weeks with chest x-ray

## 2012-03-24 ENCOUNTER — Encounter: Payer: Self-pay | Admitting: Vascular Surgery

## 2012-03-25 ENCOUNTER — Ambulatory Visit (INDEPENDENT_AMBULATORY_CARE_PROVIDER_SITE_OTHER): Payer: Medicare Other | Admitting: *Deleted

## 2012-03-25 ENCOUNTER — Ambulatory Visit (INDEPENDENT_AMBULATORY_CARE_PROVIDER_SITE_OTHER): Payer: Medicare Other | Admitting: Vascular Surgery

## 2012-03-25 ENCOUNTER — Encounter: Payer: Self-pay | Admitting: Vascular Surgery

## 2012-03-25 VITALS — BP 134/82 | HR 56 | Resp 18 | Ht 67.0 in | Wt 165.0 lb

## 2012-03-25 DIAGNOSIS — I714 Abdominal aortic aneurysm, without rupture, unspecified: Secondary | ICD-10-CM

## 2012-03-25 NOTE — Progress Notes (Signed)
The patient presents today for continued followup of his asymptomatic abdominal aortic aneurysm. He denies any symptoms referable to this. Interestingly his older brother had an open repair of this several months ago and apparently after discharge in the hospital recovery and ankle fracture and died suddenly. He denies any new cardiac difficulties been stable since coronary bypass. He has no symptoms referable to his aneurysm specifically no tenderness or abdominal pain.  Past Medical History  Diagnosis Date  . Cancer 12/2005    LULOBE  . Abdominal aortic aneurysm without mention of rupture 05/03/09    4.1X4.1 CM  . Hypertension   . Kidney stones 05/03/09    HEMATURIA,CYSTOSCOPY DONE    History  Substance Use Topics  . Smoking status: Former Smoker -- 1.0 packs/day for 50 years    Types: Cigarettes    Quit date: 12/13/2005  . Smokeless tobacco: Not on file   Comment: remotely quit smoking  . Alcohol Use: No    Family History  Problem Relation Age of Onset  . Heart disease Father     MI/CABG    Allergies  Allergen Reactions  . Ciprocin-Fluocin-Procin (Fluocinolone) Nausea Only  . Clarithromycin Nausea Only    Altered taste  . Hydrocodone-Acetaminophen Nausea Only    Current outpatient prescriptions:aspirin 81 MG tablet, Take by mouth daily., Disp: , Rfl: ;  fexofenadine (ALLEGRA) 180 MG tablet, Take 180 mg by mouth as needed. , Disp: , Rfl: ;  nebivolol (BYSTOLIC) 5 MG tablet, Take 5 mg by mouth daily., Disp: , Rfl: ;  rosuvastatin (CRESTOR) 10 MG tablet, Take 20 mg by mouth daily. , Disp: , Rfl: ;  Testosterone (AXIRON) 30 MG/ACT SOLN, Place onto the skin., Disp: , Rfl:  valsartan-hydrochlorothiazide (DIOVAN-HCT) 320-25 MG per tablet, Take 1 tablet by mouth daily., Disp: , Rfl:   BP 134/82  Pulse 56  Resp 18  Ht 5\' 7"  (1.702 m)  Wt 165 lb (74.844 kg)  BMI 25.84 kg/m2  Body mass index is 25.84 kg/(m^2).       Physical exam: Well-nourished white male no acute  distress Carotid arteries without bruits bilaterally Pulse status 2+ radial femoral popliteal and posterior tibial pulses with no evidence of peripheral aneurysm Abdomen: Soft nontender, prominent aortic pulsation with no tenderness over the aneurysm. Heart regular rate and rhythm without murmur Is clear bilaterally Neurologic grossly intact  Vascular lab: Infrarenal abdominal aortic aneurysm with maximal diameter of 4.4 cm. This compares with his prior studies. He does have increased over the course of 2 years ago with a 4.0 cm at that time  Impression and plan: Stable slight growth in known infrarenal abdominal aortic aneurysm. Discuss at length with the patient and his wife present. Recommend continued yearly ultrasound to rule out a crease size. Explained symptoms of leaking aneurysm with the patient who will present immediately to the emergency room for symptoms

## 2012-03-25 NOTE — Progress Notes (Signed)
Addended by: Sharee Pimple on: 03/25/2012 12:29 PM   Modules accepted: Orders

## 2012-03-27 NOTE — Procedures (Unsigned)
DUPLEX ULTRASOUND OF ABDOMINAL AORTA  INDICATION:  AAA followup.  HISTORY: Diabetes:  No. Cardiac:  Yes. Hypertension:  Yes. Smoking:  Previous. Connective Tissue Disorder: Family History:  No. Previous Surgery:  No.  DUPLEX EXAM:         AP (cm)                   TRANSVERSE (cm) Proximal             2.23 cm                   2.32 cm Mid                  4.35 cm                   4.44 cm Distal               2.30 cm                   2.39 cm Right Iliac          1.15 cm Left Iliac           1.15 cm                   1.12 cm  PREVIOUS:  Date: 06/23/2010  AP:  4.0  TRANSVERSE:  4.0  IMPRESSION: 1. Abdominal aortic aneurysm noted with largest measurement of 4.35 X     4.44 cm. 2. This is an increase in diameter measurements when compared to the     previous examination performed on 06/23/2010.  ___________________________________________ Larina Earthly, M.D.  EM/MEDQ  D:  03/25/2012  T:  03/25/2012  Job:  [id number]

## 2012-03-28 ENCOUNTER — Other Ambulatory Visit: Payer: Self-pay | Admitting: Thoracic Surgery

## 2012-03-28 DIAGNOSIS — R222 Localized swelling, mass and lump, trunk: Secondary | ICD-10-CM

## 2012-04-02 ENCOUNTER — Ambulatory Visit
Admission: RE | Admit: 2012-04-02 | Discharge: 2012-04-02 | Disposition: A | Discharge status: Home / Self Care | Payer: Medicare Other | Source: Ambulatory Visit | Attending: Thoracic Surgery | Admitting: Thoracic Surgery

## 2012-04-02 ENCOUNTER — Encounter: Payer: Self-pay | Admitting: Thoracic Surgery

## 2012-04-02 ENCOUNTER — Ambulatory Visit (INDEPENDENT_AMBULATORY_CARE_PROVIDER_SITE_OTHER): Payer: Medicare Other | Admitting: Thoracic Surgery

## 2012-04-02 DIAGNOSIS — Z85118 Personal history of other malignant neoplasm of bronchus and lung: Secondary | ICD-10-CM

## 2012-04-02 DIAGNOSIS — Z952 Presence of prosthetic heart valve: Secondary | ICD-10-CM

## 2012-04-02 DIAGNOSIS — R222 Localized swelling, mass and lump, trunk: Secondary | ICD-10-CM

## 2012-04-02 DIAGNOSIS — Z9889 Other specified postprocedural states: Secondary | ICD-10-CM

## 2012-04-02 DIAGNOSIS — Z951 Presence of aortocoronary bypass graft: Secondary | ICD-10-CM

## 2012-04-02 DIAGNOSIS — Z954 Presence of other heart-valve replacement: Secondary | ICD-10-CM

## 2012-04-02 DIAGNOSIS — R042 Hemoptysis: Secondary | ICD-10-CM

## 2012-04-02 DIAGNOSIS — Z902 Acquired absence of lung [part of]: Secondary | ICD-10-CM

## 2012-04-02 NOTE — Progress Notes (Signed)
HPI the patient returns for final followup. Chest x-ray shows no evidence of recurrence of his cancer. He is now 5/2 years since his surgery. He's had no recurrence of his heart disease since his bypass surgery. Incisions are well-healed doing well overall we'll see him back as needed   Current Outpatient Prescriptions  Medication Sig Dispense Refill  . aspirin 81 MG tablet Take by mouth daily.      . fexofenadine (ALLEGRA) 180 MG tablet Take 180 mg by mouth as needed.       . nebivolol (BYSTOLIC) 5 MG tablet Take 5 mg by mouth daily.      . rosuvastatin (CRESTOR) 10 MG tablet Take 20 mg by mouth daily.       . Testosterone (AXIRON) 30 MG/ACT SOLN Place onto the skin.      . valsartan-hydrochlorothiazide (DIOVAN-HCT) 320-25 MG per tablet Take 1 tablet by mouth daily.         Review of Systems: Unchanged   Physical Exam lungs are clear to auscultation percussion   Diagnostic Tests: Chest x-ray shows normal postoperative changes   Impression: Status post left upper lobectomy for non-small cell lung cancer 5/2 years ago   Plan: Return as needed

## 2013-01-13 ENCOUNTER — Other Ambulatory Visit: Payer: Self-pay | Admitting: *Deleted

## 2013-01-13 DIAGNOSIS — Z01810 Encounter for preprocedural cardiovascular examination: Secondary | ICD-10-CM

## 2013-01-13 DIAGNOSIS — I714 Abdominal aortic aneurysm, without rupture: Secondary | ICD-10-CM

## 2013-02-03 ENCOUNTER — Ambulatory Visit: Payer: Medicare Other | Admitting: Vascular Surgery

## 2013-02-09 ENCOUNTER — Encounter: Payer: Self-pay | Admitting: Vascular Surgery

## 2013-02-10 ENCOUNTER — Encounter: Payer: Self-pay | Admitting: Vascular Surgery

## 2013-02-10 ENCOUNTER — Encounter (INDEPENDENT_AMBULATORY_CARE_PROVIDER_SITE_OTHER): Payer: Medicare Other | Admitting: *Deleted

## 2013-02-10 ENCOUNTER — Other Ambulatory Visit: Payer: Medicare Other

## 2013-02-10 ENCOUNTER — Ambulatory Visit (INDEPENDENT_AMBULATORY_CARE_PROVIDER_SITE_OTHER): Payer: Medicare Other | Admitting: Vascular Surgery

## 2013-02-10 ENCOUNTER — Ambulatory Visit: Payer: Medicare Other | Admitting: Vascular Surgery

## 2013-02-10 VITALS — BP 179/109 | HR 84 | Resp 16 | Ht 67.0 in | Wt 162.0 lb

## 2013-02-10 DIAGNOSIS — I714 Abdominal aortic aneurysm, without rupture: Secondary | ICD-10-CM

## 2013-02-10 NOTE — Addendum Note (Signed)
Addended by: Adria Dill L on: 02/10/2013 10:24 AM   Modules accepted: Orders

## 2013-02-10 NOTE — Progress Notes (Signed)
The patient presents today for continued followup of his infrarenal abdominal aortic aneurysm. He remains asymptomatic. He did have 2 admissions this winter for pneumonia. He responded to IV antibiotics. Does have chronic issues secondary to resection of lung cancer in 2007 and COPD. He does have a significant coronary disease as well.  Past Medical History  Diagnosis Date  . Cancer 12/2005    LULOBE  . Abdominal aortic aneurysm without mention of rupture 05/03/09    4.1X4.1 CM  . Hypertension   . Kidney stones 05/03/09    HEMATURIA,CYSTOSCOPY DONE    History  Substance Use Topics  . Smoking status: Former Smoker -- 1.00 packs/day for 50 years    Types: Cigarettes    Quit date: 12/13/2005  . Smokeless tobacco: Never Used     Comment: remotely quit smoking  . Alcohol Use: No    Family History  Problem Relation Age of Onset  . Heart disease Father     MI/CABG    Allergies  Allergen Reactions  . Ciprocin-Fluocin-Procin (Fluocinolone) Nausea Only  . Clarithromycin Nausea Only    Altered taste  . Hydrocodone-Acetaminophen Nausea Only    Current outpatient prescriptions:aspirin 81 MG tablet, Take by mouth daily., Disp: , Rfl: ;  fexofenadine (ALLEGRA) 180 MG tablet, Take 180 mg by mouth as needed. , Disp: , Rfl: ;  nebivolol (BYSTOLIC) 5 MG tablet, Take 5 mg by mouth daily., Disp: , Rfl: ;  rosuvastatin (CRESTOR) 10 MG tablet, Take 20 mg by mouth daily. , Disp: , Rfl: ;  valsartan-hydrochlorothiazide (DIOVAN-HCT) 320-25 MG per tablet, Take 1 tablet by mouth daily., Disp: , Rfl:  Testosterone (AXIRON) 30 MG/ACT SOLN, Place onto the skin., Disp: , Rfl:   BP 179/109  Pulse 84  Resp 16  Ht 5\' 7"  (1.702 m)  Wt 162 lb (73.483 kg)  BMI 25.37 kg/m2  Body mass index is 25.37 kg/(m^2).       Exam well-developed well-nourished white male appearing stated age in no acute distress Carotid arteries without bruits bilaterally Pulse status: 2+ radial 2+ femoral 2+ popliteal 2+  posterior tibial pulses bilaterally Heart regular rate and rhythm without murmur Chest clear bilaterally without wheezes Abdomen soft nontender prominent aortic pulse with no tenderness over the aneurysm, no masses noted  Vascular lab study today was ordered and independently reviewed by myself. This does show increased in size to 4.7 cm out from 4.3 cm in May of 2013.  Pressure and plan asymptomatic infrarenal 4.7 cm abdominal aortic aneurysm. A long discussion with the patient and his family regarding this. I again reviewed signs of leaking aneurysm in this report immediately to [place] should this occur areas otherwise he'll be seen at 6 month intervals to his aneurysm is greater than 4.5 cm. I discussed open and stent graft repair.

## 2013-02-18 ENCOUNTER — Other Ambulatory Visit: Payer: Self-pay | Admitting: *Deleted

## 2013-02-18 DIAGNOSIS — I714 Abdominal aortic aneurysm, without rupture: Secondary | ICD-10-CM

## 2013-03-25 ENCOUNTER — Other Ambulatory Visit: Payer: Self-pay | Admitting: Cardiology

## 2013-03-25 ENCOUNTER — Ambulatory Visit
Admission: RE | Admit: 2013-03-25 | Discharge: 2013-03-25 | Disposition: A | Discharge status: Home / Self Care | Payer: Medicare Other | Source: Ambulatory Visit | Attending: Cardiology | Admitting: Cardiology

## 2013-03-25 ENCOUNTER — Ambulatory Visit: Payer: Medicare Other | Admitting: Neurosurgery

## 2013-03-25 DIAGNOSIS — R0602 Shortness of breath: Secondary | ICD-10-CM

## 2013-04-01 ENCOUNTER — Ambulatory Visit: Payer: Medicare Other | Admitting: Neurosurgery

## 2013-08-31 ENCOUNTER — Encounter: Payer: Self-pay | Admitting: Vascular Surgery

## 2013-09-01 ENCOUNTER — Encounter: Payer: Self-pay | Admitting: Vascular Surgery

## 2013-09-01 ENCOUNTER — Ambulatory Visit (HOSPITAL_COMMUNITY)
Admission: RE | Admit: 2013-09-01 | Discharge: 2013-09-01 | Disposition: A | Discharge status: Home / Self Care | Payer: Medicare Other | Source: Ambulatory Visit | Attending: Vascular Surgery | Admitting: Vascular Surgery

## 2013-09-01 ENCOUNTER — Ambulatory Visit (INDEPENDENT_AMBULATORY_CARE_PROVIDER_SITE_OTHER): Payer: Medicare Other | Admitting: Vascular Surgery

## 2013-09-01 VITALS — BP 136/93 | HR 71 | Resp 16 | Ht 67.0 in | Wt 162.0 lb

## 2013-09-01 DIAGNOSIS — I714 Abdominal aortic aneurysm, without rupture, unspecified: Secondary | ICD-10-CM | POA: Insufficient documentation

## 2013-09-01 DIAGNOSIS — Z48812 Encounter for surgical aftercare following surgery on the circulatory system: Secondary | ICD-10-CM

## 2013-09-01 DIAGNOSIS — R252 Cramp and spasm: Secondary | ICD-10-CM | POA: Insufficient documentation

## 2013-09-01 NOTE — Progress Notes (Signed)
The patient presents today for continued followup of his infrarenal abdominal aortic aneurysm. He has had some pulmonary issues with the upper airway congestion. Has had no further pneumonia since her last visit with him. Did have a facial trauma from a baseball and a college again with a fracture of his right orbit but has recovered from this. No symptoms referable to his aneurysm  Past Medical History  Diagnosis Date  . Cancer 12/2005    LULOBE  . Abdominal aortic aneurysm without mention of rupture 05/03/09    4.1X4.1 CM  . Hypertension   . Kidney stones 05/03/09    HEMATURIA,CYSTOSCOPY DONE    History  Substance Use Topics  . Smoking status: Former Smoker -- 1.00 packs/day for 50 years    Types: Cigarettes    Quit date: 12/13/2005  . Smokeless tobacco: Never Used     Comment: remotely quit smoking  . Alcohol Use: No    Family History  Problem Relation Age of Onset  . Heart disease Father     MI/CABG    Allergies  Allergen Reactions  . Ciprocin-Fluocin-Procin [Fluocinolone] Nausea Only  . Clarithromycin Nausea Only    Altered taste  . Hydrocodone-Acetaminophen Nausea Only    Current outpatient prescriptions:albuterol (PROVENTIL) (2.5 MG/3ML) 0.083% nebulizer solution, continuous as needed., Disp: , Rfl: ;  aspirin 81 MG tablet, Take by mouth daily., Disp: , Rfl: ;  clindamycin-benzoyl peroxide (BENZACLIN) gel, Apply topically continuous as needed., Disp: , Rfl: ;  fexofenadine (ALLEGRA) 180 MG tablet, Take 180 mg by mouth as needed. , Disp: , Rfl:  nebivolol (BYSTOLIC) 5 MG tablet, Take 5 mg by mouth daily., Disp: , Rfl: ;  rosuvastatin (CRESTOR) 10 MG tablet, Take 40 mg by mouth daily. , Disp: , Rfl: ;  valsartan-hydrochlorothiazide (DIOVAN-HCT) 320-25 MG per tablet, Take 1 tablet by mouth daily., Disp: , Rfl: ;  Testosterone (AXIRON) 30 MG/ACT SOLN, Place onto the skin., Disp: , Rfl:   BP 136/93  Pulse 71  Resp 16  Ht 5\' 7"  (1.702 m)  Wt 162 lb (73.483 kg)  BMI 25.37  kg/m2  SpO2 98%  Body mass index is 25.37 kg/(m^2).       Physical exam: Well-developed well-nourished white male no acute distress Radial pulses 2+ femoral pulses 2+ bilaterally Respirations equal nonlabored Abdomen soft nontender with prominent aortic pulsation and no tenderness over his aneurysm  Ultrasound duplex today of his aorta shows no change in his maximal diameter from 6 months ago. Remains at 4.7 cm  Impression and plan: Asymptomatic 4.7 cm aneurysm with no growth in 6 months. We'll continue to be seen in our office at 6 month intervals for surveillance. Again reviewed symptoms of leaking aneurysm and the patient knows to the report to the [facility] hospital should this occur.

## 2013-09-01 NOTE — Addendum Note (Signed)
Addended by: Adria Dill L on: 09/01/2013 04:12 PM   Modules accepted: Orders

## 2014-03-01 ENCOUNTER — Encounter: Payer: Self-pay | Admitting: Family

## 2014-03-02 ENCOUNTER — Other Ambulatory Visit (HOSPITAL_COMMUNITY): Payer: Medicare Other

## 2014-03-02 ENCOUNTER — Ambulatory Visit (HOSPITAL_COMMUNITY)
Admission: RE | Admit: 2014-03-02 | Discharge: 2014-03-02 | Disposition: A | Discharge status: Home / Self Care | Payer: Medicare HMO | Source: Ambulatory Visit | Attending: Family | Admitting: Family

## 2014-03-02 ENCOUNTER — Encounter: Payer: Self-pay | Admitting: Family

## 2014-03-02 ENCOUNTER — Ambulatory Visit (INDEPENDENT_AMBULATORY_CARE_PROVIDER_SITE_OTHER): Payer: Medicare HMO | Admitting: Family

## 2014-03-02 ENCOUNTER — Ambulatory Visit: Payer: Medicare Other | Admitting: Family

## 2014-03-02 VITALS — BP 155/92 | HR 71 | Resp 16 | Ht 67.0 in | Wt 133.0 lb

## 2014-03-02 DIAGNOSIS — I714 Abdominal aortic aneurysm, without rupture, unspecified: Secondary | ICD-10-CM

## 2014-03-02 DIAGNOSIS — Z48812 Encounter for surgical aftercare following surgery on the circulatory system: Secondary | ICD-10-CM

## 2014-03-02 DIAGNOSIS — R252 Cramp and spasm: Secondary | ICD-10-CM

## 2014-03-02 NOTE — Patient Instructions (Signed)

## 2014-03-02 NOTE — Progress Notes (Signed)
[facility] [place]  Established Abdominal Aortic Aneurysm  History of Present Illness  Kenneth [name] is a 78 y.o. (02-12-1936) male patient of Dr. Donnetta Hutching who presents with chief complaint: follow up for AAA.  Previous studies demonstrate an AAA, measuring 4.72 cm.  The patient does not have back or abdominal pain.  The patient is not a smoker. The patient denies claudication in legs with walking. The patient denies history of stroke or TIA symptoms. Had 3 vessel CABG and aortic valve replacement in 2011, his cardiologist is Dr. Wynonia Lawman. Crestor caused myalgias, is taking Red Yeast Rice instead.  Is taking a daily ASA and a beta blocker. He does rarely uses ETOH. He is physically active, is dyspneic with moderate exertion. His brother had an AAA in addition to ESRD, but died with his first dialysis session.  Pt Diabetic: No Pt smoker: former smoker, quit in 2007  Past Medical History  Diagnosis Date  . Cancer 12/2005    LULOBE  . Abdominal aortic aneurysm without mention of rupture 05/03/09    4.1X4.1 CM  . Hypertension   . Kidney stones 05/03/09    HEMATURIA,CYSTOSCOPY DONE   Past Surgical History  Procedure Laterality Date  . Coronary artery bypass graft  02/2010  . Tissue aortic valve replacement  02/2010  . Tonsillectomy    . Lul resection  12/2005    DR, [name]  Lung Surgery  . Colonoscopy  2008    NORMAL   Social History History   Social History  . Marital Status: Married    Spouse Name: N/A    Number of Children: N/A  . Years of Education: N/A   Occupational History  . Not on file.   Social History Main Topics  . Smoking status: Former Smoker -- 1.00 packs/day for 50 years    Types: Cigarettes    Quit date: 12/13/2005  . Smokeless tobacco: Never Used     Comment: remotely quit smoking  . Alcohol Use: No  . Drug Use: No  . Sexual Activity: Not on file   Other Topics Concern  . Not on file   Social History Narrative  . No  narrative on file   Family History Family History  Problem Relation Age of Onset  . Heart disease Father     MI/CABG    Current Outpatient Prescriptions on File Prior to Visit  Medication Sig Dispense Refill  . aspirin 81 MG tablet Take by mouth daily.      . fexofenadine (ALLEGRA) 180 MG tablet Take 180 mg by mouth as needed.       . nebivolol (BYSTOLIC) 5 MG tablet Take 5 mg by mouth daily.      . valsartan-hydrochlorothiazide (DIOVAN-HCT) 320-25 MG per tablet Take 1 tablet by mouth daily.      Marland Kitchen albuterol (PROVENTIL) (2.5 MG/3ML) 0.083% nebulizer solution continuous as needed.      . clindamycin-benzoyl peroxide (BENZACLIN) gel Apply topically continuous as needed.      . rosuvastatin (CRESTOR) 10 MG tablet Take 40 mg by mouth daily.       . Testosterone (AXIRON) 30 MG/ACT SOLN Place onto the skin.       No current facility-administered medications on file prior to visit.   Allergies  Allergen Reactions  . Ciprocin-Fluocin-Procin [Fluocinolone] Nausea Only  . Clarithromycin Nausea Only    Altered taste  . Hydrocodone-Acetaminophen Nausea Only    ROS: See HPI for pertinent positives and negatives.  Physical Examination  Filed Vitals:   03/02/14 0957  BP: 155/92  Pulse: 71  Resp: 16  Height: 5\' 7"  (1.702 m)  Weight: 133 lb (60.328 kg)  SpO2: 98%   Body mass index is 20.83 kg/(m^2).  General: A&O x 3, WD.  Pulmonary: Sym exp, good air movt, CTAB, no rales, rhonchi, or wheezing.  Cardiac: RRR, Nl S1, S2, no detected murmur.   Carotid Bruits Left Right   Negative Negative   Aorta is  palpable Radial pulses are 2+ palpable and =.                          VASCULAR EXAM:                                                                                                         LE Pulses LEFT RIGHT       FEMORAL   palpable   palpable        POPLITEAL  not  palpable   not palpable       POSTERIOR TIBIAL   palpable    palpable        DORSALIS PEDIS      ANTERIOR  TIBIAL  palpable   palpable      Gastrointestinal: soft, NTND, -G/R, - HSM, - masses, - CVAT B.  Musculoskeletal: M/S 5/5 throughout, Extremities without ischemic changes.  Neurologic: CN 2-12 intact, Pain and light touch intact in extremities are intact, Motor exam as listed above.  Non-Invasive Vascular Imaging  AAA Duplex (03/02/2014)  Previous size: 4.72 cm (Date: 09/01/2013)  Current size:  4.82 cm (Date: 03/02/2014)  Medical Decision Making  The patient is a 78 y.o. male who presents with asymptomatic AAA with no increase in size.   Based on this patient's exam and diagnostic studies, the patient will follow up in 6 months  with the following studies: AAA Duplex.  Consideration for repair of AAA would be made when the size reaches 5.5 cm, growth > 1 cm/yr, and symptomatic status.  I emphasized the importance of maximal medical management including strict control of blood pressure, blood glucose, and lipid levels, antiplatelet agents, obtaining regular exercise, and continued cessation of smoking.   The patient was given information about AAA including signs, symptoms, treatment, and how to minimize the risk of enlargement and rupture of aneurysms.    The patient was advised to call 911 should the patient experience sudden onset abdominal or back pain.   Thank you for allowing [place] to participate in this patient's care.  Clemon Chambers, RN, MSN, FNP-C [facility] [place] Office: [phone]  Clinic Physician: Early  03/02/2014, 10:26 AM

## 2014-03-10 ENCOUNTER — Encounter: Payer: Self-pay | Admitting: Cardiology

## 2014-03-10 DIAGNOSIS — Z85118 Personal history of other malignant neoplasm of bronchus and lung: Secondary | ICD-10-CM

## 2014-03-10 DIAGNOSIS — E119 Type 2 diabetes mellitus without complications: Secondary | ICD-10-CM

## 2014-03-10 DIAGNOSIS — I714 Abdominal aortic aneurysm, without rupture, unspecified: Secondary | ICD-10-CM

## 2014-03-10 DIAGNOSIS — M51379 Other intervertebral disc degeneration, lumbosacral region without mention of lumbar back pain or lower extremity pain: Secondary | ICD-10-CM

## 2014-03-10 DIAGNOSIS — K219 Gastro-esophageal reflux disease without esophagitis: Secondary | ICD-10-CM

## 2014-03-10 DIAGNOSIS — J449 Chronic obstructive pulmonary disease, unspecified: Secondary | ICD-10-CM

## 2014-03-10 DIAGNOSIS — I5032 Chronic diastolic (congestive) heart failure: Secondary | ICD-10-CM

## 2014-03-10 DIAGNOSIS — Z952 Presence of prosthetic heart valve: Secondary | ICD-10-CM

## 2014-03-10 DIAGNOSIS — R0602 Shortness of breath: Secondary | ICD-10-CM

## 2014-03-10 DIAGNOSIS — M5137 Other intervertebral disc degeneration, lumbosacral region: Secondary | ICD-10-CM

## 2014-03-10 DIAGNOSIS — E785 Hyperlipidemia, unspecified: Secondary | ICD-10-CM

## 2014-03-10 DIAGNOSIS — J4489 Other specified chronic obstructive pulmonary disease: Secondary | ICD-10-CM

## 2014-03-10 DIAGNOSIS — I2510 Atherosclerotic heart disease of native coronary artery without angina pectoris: Secondary | ICD-10-CM

## 2014-03-10 DIAGNOSIS — I10 Essential (primary) hypertension: Secondary | ICD-10-CM

## 2014-03-10 HISTORY — DX: Atherosclerotic heart disease of native coronary artery without angina pectoris: I25.10

## 2014-03-10 HISTORY — DX: Type 2 diabetes mellitus without complications: E11.9

## 2014-03-10 NOTE — Progress Notes (Signed)
Patient ID: BALLARD BUDNEY, male   DOB: 01-May-1936, 78 y.o.   MRN: [id number]  Jaymon, Dudek  Date of visit:  03/10/2014 DOB:  April 29, 1936    Age:  78 yrs. Medical record number:  [id number]     Account number:  [id number] Primary Care Provider: Jerene Bears ____________________________ CURRENT DIAGNOSES  1. Dyspnea  2. CAD,Native  3. Hypertension-Essential (Benign)  4. Hyperlipidemia  5. Surgery-S/P AVR  6. COPD  7. Aneurysm-Abdominal  8. Personal History Of Malignant Neoplasm Of Bronchus And Lung  9. Diabetes Mellitus-NIDD  10. Aortic Valve Disorder  11. Surgery-Aortocoronary Bypass Grafting  12. Stent Placement ____________________________ ALLERGIES  Niacin, Intolerance-unknown  Penicillins, Intolerance-unknown ____________________________ MEDICATIONS  1. Cialis 20 mg Tablet, PRN  2. Diovan HCT 320-25 mg tablet, 1/2 tab daily  3. Crestor 40 mg tablet, 1 p.o. daily  4. aspirin 81 mg chewable tablet, 1 p.o. daily  5. nitroglycerin 0.4 mg sublingual tablet, PRN  6. omeprazole 40 mg capsule,delayed release, 1 p.o. daily  7. Bystolic 5 mg tablet, 1 p.o. daily  8. Spiriva with HandiHaler 18 mcg & inhalation capsules, qd  9. Arcapta Neohaler 75 mcg capsule with inhalation device, qd ____________________________ CHIEF COMPLAINTS  Followup of CAD,Native ____________________________ HISTORY OF PRESENT ILLNESS Patient seen for followup of her dyspnea. He has actually gotten better in terms of his dyspnea since he was here all but he still gives out when he tries to walk up stairs or period heavy things in his arms. He can walk on flat distances without difficulty. He went to get a second opinion at [facility] and is seeing a pulmonologist there. He has been placed on a couple of more breathing medications and evidently the mass that was in question is not being pursued further at this time. His blood pressure medicines were recently cut down. He denies angina and has no PND, orthopnea  or edema. Previous echocardiogram showed a normally functioning aortic valve prosthesis. ____________________________ PAST HISTORY  Past Medical Illnesses:  hypertension, hyperlipidemia, stage 1 non-small cell lung cancer treated with surgery 2/07, AAA-asymptomatic, history of kidney stones, lumbar disc disease, esophageal stricture, COPD, renal cyst;  Cardiovascular Illnesses:  CAD, aortic stenosis;  Surgical Procedures:  cysto x2, lobectomy l lung, tonsillectomy;  NYHA Classification:  II;  Cardiology Procedures-Invasive:  PTCA of the RCA 1996, Cypher Stent RCA September 2003, cardiac cath (right and left) April 2011, AVR (tissue), CABG w LIMA -LAD, SVG-int, SVG-PDA Dr. Roxan Hockey 03/01/10;  Cardiology Procedures-Noninvasive:  echocardiogram October 2010, echocardiogram October 2011, echocardiogram May 2014, echocardiogram January 2015;  Cardiac Cath Results:  scattered irregularities Left main, diffuse luminal irregularities LAD, no significant disease CFX, 90% stenosis distal RCA, 90% stenosis ostial PDA;  Peripheral Vascular Procedures:  MRA Abdomen May 2007, carotid doppler May 2007;  LVEF of 45% documented via echocardiogram on 11/18/2013,   ____________________________ CARDIO-PULMONARY TEST DATES EKG Date:  11/13/2013;   Cardiac Cath Date:  02/16/2010;  CABG: 03/01/2010;  Stent Placement Date: 07/23/2002;  Echocardiography Date: 11/18/2013;  Chest Xray Date: 02/08/2010;  CT Scan Date:  04/02/2006   ____________________________ FAMILY HISTORY Brother -- Brother dead, Renal disorder, Coronary Artery Disease Father -- Father dead, Premature coronary heart disease, Deceased Mother -- Mother dead, Death due to natural cause ____________________________ SOCIAL HISTORY Alcohol Use:  no alcohol use;  Smoking:  used to smoke but quit 2007;  Diet:  regular diet without modifications;  Lifestyle:  divorced, remarried and 12 years;  Exercise:  exercise is limited due to physical  disability;  Occupation:   retired and works part time [employer];  Residence:  lives with wife;   ____________________________ REVIEW OF SYSTEMS General:  malaise and fatigue Eyes: denies diplopia, history of glaucoma or visual problems. Respiratory: dyspnea with exertion Cardiovascular:  please review HPI Abdominal: denies dyspepsia, GI bleeding, constipation, or diarrhea Genitourinary-Male: hesitancy  Musculoskeletal:  arthritis of the wrists Neurological:  occasional headaches  ____________________________ PHYSICAL EXAMINATION VITAL SIGNS  Blood Pressure:  142/80 Supine, Left arm, regular cuff  , 134/80 Sitting, Left arm and regular cuff   Pulse:  96/min. Weight:  161.00 lbs. Height:  67"BMI: 25  Constitutional:  pleasant white male in no acute distress Skin:  healed skin cancer removal lesions Head:  normocephalic, balding male hair pattern Eyes:  EOMS Intact, PERRLA, C and S clear, Funduscopic exam not done. ENT:  upper dentures present Chest:  clear to auscultation, healed thoracotomy scar, healed median sternotomy scar Cardiac:  regular rhythm, normal S1 and S2, no S3 or S4, grade 1/6 systolic murmur at aortic area radiating to neck Peripheral Pulses:  femoral pulses 2+, dorsalis pedis pulses 1+, posterior tibial pulses 1+ Extremities & Back:  well healed saphenous vein donor site RLE Neurological:  no gross motor or sensory deficits noted, affect appropriate, oriented x3. ____________________________ MOST RECENT LIPID PANEL 07/31/13  CHOL TOTL 210 mg/dl, LDL 123 calc, HDL 49 mg/dl, TRIGLYCER 193 mg/dl and CHOL/HDL 4.3 (Calc) ____________________________ IMPRESSIONS/PLAN  1. Dyspnea which is likely multifactorial in a patient with previous lobectomy, underlying COPD and diastolic dysfunction 2. Prior aortic valve replacement 3. Coronary artery disease with previous bypass grafting 4. Hypertension controlled  Recommendations:  He clinically appears to be somewhat better but is still limited. I told  him at this point since he is improving and we will continue pulmonary treatment. Suggested either a cardiopulmonary stress test if the dyspnea continues or a trial of pulmonary rehabilitation. We can also do catheterization if things worsen. Followup in 6 months. ____________________________ TODAYS ORDERS  1. Return Visit: 6 months                       ____________________________ Cardiology Physician:  Kerry Hough MD [facility]

## 2014-08-27 ENCOUNTER — Encounter: Payer: Self-pay | Admitting: Family

## 2014-08-30 ENCOUNTER — Ambulatory Visit (HOSPITAL_COMMUNITY)
Admission: RE | Admit: 2014-08-30 | Discharge: 2014-08-30 | Disposition: A | Discharge status: Home / Self Care | Payer: Medicare HMO | Source: Ambulatory Visit | Attending: Family | Admitting: Family

## 2014-08-30 ENCOUNTER — Other Ambulatory Visit: Payer: Self-pay | Admitting: Family

## 2014-08-30 ENCOUNTER — Encounter: Payer: Self-pay | Admitting: Family

## 2014-08-30 ENCOUNTER — Ambulatory Visit (INDEPENDENT_AMBULATORY_CARE_PROVIDER_SITE_OTHER): Payer: Medicare HMO | Admitting: Family

## 2014-08-30 ENCOUNTER — Telehealth: Payer: Self-pay | Admitting: Vascular Surgery

## 2014-08-30 VITALS — BP 149/98 | HR 76 | Resp 16 | Ht 67.0 in | Wt 157.0 lb

## 2014-08-30 DIAGNOSIS — Z01818 Encounter for other preprocedural examination: Secondary | ICD-10-CM

## 2014-08-30 DIAGNOSIS — Z48812 Encounter for surgical aftercare following surgery on the circulatory system: Secondary | ICD-10-CM | POA: Diagnosis not present

## 2014-08-30 DIAGNOSIS — I714 Abdominal aortic aneurysm, without rupture, unspecified: Secondary | ICD-10-CM

## 2014-08-30 NOTE — Progress Notes (Signed)
[facility] [place]  Established Abdominal Aortic Aneurysm  History of Present Illness  Kenneth [name] is a 78 y.o. (10-Jun-1936) male patient of Dr. Donnetta Hutching who presents with chief complaint: follow up for AAA. Previous studies demonstrate an AAA, measuring 4.72 cm. The patient does not have back or abdominal pain. The patient is a former smoker.  Patient states his blood pressure is usually 128-138/70-80; he states that he takes his antihypertensive medication if his blood pressure gets above 140/90, he states as advised by Dr. Wynonia Lawman.  The patient denies claudication in legs with walking, denies a history of stroke or TIA symptoms.   He had a 3 vessel CABG and aortic valve replacement in 2011, his cardiologist is Dr. Wynonia Lawman.  Crestor caused myalgias, is taking Red Yeast Rice instead.  He is taking a daily ASA and a beta blocker.  He does rarely uses ETOH.  He is physically active, is dyspneic with moderate exertion.  His brother had an AAA in addition to ESRD, but died with his first dialysis session.   Pt Diabetic: No  Pt smoker: former smoker, quit in 2007   Past Medical History  Diagnosis Date  . Abdominal aortic aneurysm without mention of rupture   . Kidney stones 05/03/09    HEMATURIA,CYSTOSCOPY DONE  . CAD (coronary artery disease), native coronary artery 03/10/2014    02/28/10 AVR with tissue valve, Cabg with LIMA to LAD, SVG to int, and SVG to RCA Dr. Roxan Hockey  Cath 02/16/2010 Calcified coronaries  LM calcified, LAD, diffuse disease, circ calcified with 60% mid, RCA calcified with 80% prox and 50% mid  36 mm Peak gradient, severe aortic stenosis  2003 Cypher stent in distal RCA , PTCA of diagonal 1996 PTCA of RCA in 2 places with restenosis treated again   . Hypertensive heart disease   . Diabetes mellitus type 2, noninsulin dependent 03/10/2014  . COPD   . Chronic dyspnea   . Chronic diastolic heart failure   . Lumbar disc disease   . Hyperlipidemia    . History of lung cancer 12/13/2005    LUL lobe Stage 1 non small cell cancer treated with resection Dr. [name]   . GERD 08/06/2008   Past Surgical History  Procedure Laterality Date  . Coronary artery bypass graft  02/2010  . Tissue aortic valve replacement  02/2010  . Tonsillectomy    . Lul resection  12/2005    DR, [name]  Lung Surgery  . Colonoscopy  2008    NORMAL   Social History History   Social History  . Marital Status: Married    Spouse Name: N/A    Number of Children: N/A  . Years of Education: N/A   Occupational History  . Not on file.   Social History Main Topics  . Smoking status: Former Smoker -- 1.00 packs/day for 50 years    Types: Cigarettes    Quit date: 12/13/2005  . Smokeless tobacco: Never Used     Comment: remotely quit smoking  . Alcohol Use: No  . Drug Use: No  . Sexual Activity: Not on file   Other Topics Concern  . 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 Heart disease Father     MI/CABG    Current Outpatient  Prescriptions on File Prior to Visit  Medication Sig Dispense Refill  . albuterol (PROVENTIL) (2.5 MG/3ML) 0.083% nebulizer solution continuous as needed.      Marland Kitchen aspirin 81 MG tablet Take by mouth daily.      . fexofenadine (ALLEGRA) 180 MG tablet Take 180 mg by mouth as needed.       . nebivolol (BYSTOLIC) 5 MG tablet Take 5 mg by mouth daily.      . valsartan-hydrochlorothiazide (DIOVAN-HCT) 320-25 MG per tablet Take 1 tablet by mouth daily.      . clindamycin-benzoyl peroxide (BENZACLIN) gel Apply topically continuous as needed.      . Indacaterol Maleate (ARCAPTA NEOHALER IN) Inhale into the lungs.      . rosuvastatin (CRESTOR) 10 MG tablet Take 40 mg by mouth daily.       . Testosterone (AXIRON) 30 MG/ACT SOLN Place onto the  skin.      Marland Kitchen tiotropium (SPIRIVA) 18 MCG inhalation capsule Place 18 mcg into inhaler and inhale daily.       No current facility-administered medications on file prior to visit.   Allergies  Allergen Reactions  . Ciprocin-Fluocin-Procin [Fluocinolone] Nausea Only  . Clarithromycin Nausea Only    Altered taste  . Hydrocodone-Acetaminophen Nausea Only    ROS: See HPI for pertinent positives and negatives.  Physical Examination  Filed Vitals:   08/30/14 1022  BP: 149/98  Pulse: 76  Resp: 16  Height: 5\' 7"  (1.702 m)  Weight: 157 lb (71.215 kg)  SpO2: 96%   Body mass index is 24.58 kg/(m^2).  General: A&O x 3, WD.  Pulmonary: Sym exp, good air movt, CTAB, no rales, rhonchi, or wheezing. Chronic cough from COPD. Cardiac: RRR, Nl S1, S2, no detected murmur.   Carotid Bruits  Left  Right    Negative  Negative   Aorta is palpable  Radial pulses are 2+ palpable and =.   VASCULAR EXAM:  LE Pulses  LEFT  RIGHT   FEMORAL  2+palpable  2+palpable   POPLITEAL  not palpable  not palpable   POSTERIOR TIBIAL  2+palpable  1+palpable   DORSALIS PEDIS  ANTERIOR TIBIAL  1+palpable  2+palpable    Gastrointestinal: soft, NTND, -G/R, - HSM, - masses palpated, - CVAT B.  Musculoskeletal: M/S 5/5 throughout, Extremities without ischemic changes.  Neurologic: CN 2-12 intact, Pain and light touch intact in extremities are intact, Motor exam as listed above.  Non-Invasive Vascular Imaging  AAA Duplex (08/30/2014)  ABDOMINAL AORTA DUPLEX EVALUATION    INDICATION: Abdominal Aortic Aneurysm    PREVIOUS INTERVENTION(S): NA    DUPLEX EXAM:     LOCATION DIAMETER AP (cm) DIAMETER TRANSVERSE (cm) VELOCITIES (cm/sec)  Aorta Proximal 2.39 2.27 48  Aorta Mid 2.47 2.64 73  Aorta Distal 5.09 5.14 26  Right Common Iliac Artery 1.11 1.08 184  Left Common Iliac Artery 1.19 1.17 68    Previous max aortic diameter:  4.72cm x 4.82cm Date: 03/02/2014     ADDITIONAL FINDINGS:     IMPRESSION:  Abdominal Aortic Aneurysm measuring approximately 5.09cm x 5.14cm, with non-occluding intramural thrombus present. Elevated velocity of the right common iliac artery suggestive of greater than 50% stenosis, vessel tortuousity is present which may overestimate disease.    Compared to the previous exam:  Increase in diameter since previous study on 03/02/2014.    Medical Decision Making  The patient is a 78 y.o. male who presents with asymptomatic AAA with increase in diameter to 5.14 cm since previous  study on 03/02/2014.   Based on this patient's exam and diagnostic studies, and after discussing with Dr.  Trula Slade, the patient will be scheduled for a CTA abdomen/pelvis within the next 1-2 weeks and follow up with Dr. Donnetta Hutching.  Consideration for repair of AAA would be made when the size approaches 4.8 or 5.0 cm, growth > 1 cm/yr, and symptomatic status.  I emphasized the importance of maximal medical management including strict control of blood pressure, blood glucose, and lipid levels, antiplatelet agents, obtaining regular exercise, and continued  cessation of smoking.   The patient was given information about AAA including signs, symptoms, treatment, and how to minimize the risk of enlargement and rupture of aneurysms.    The patient was advised to call 911 should the patient experience sudden onset abdominal or back pain.   Thank you for allowing [place] to participate in this patient's care.  Clemon Chambers, RN, MSN, FNP-C [facility] Office: [facility] Physician: Trula Slade  08/30/2014, 10:20 AM

## 2014-08-30 NOTE — Addendum Note (Signed)
Addended by: Mena Goes on: 08/30/2014 11:39 AM   Modules accepted: Orders

## 2014-08-30 NOTE — Telephone Encounter (Signed)
Spoke with pt. Gave following info: CTA 09/07/14 11 am [facility] then Dr. Donnetta Hutching at 1:45 pm.  Pt will have labs drawn at [facility] F: [phone] Order faxed. Office aware. Pt verbalized understanding.

## 2014-08-30 NOTE — Patient Instructions (Signed)
Abdominal Aortic Aneurysm An aneurysm is a weakened or damaged part of an artery wall that bulges from the normal force of blood pumping through the body. An abdominal aortic aneurysm is an aneurysm that occurs in the lower part of the aorta, the main artery of the body.  The major concern with an abdominal aortic aneurysm is that it can enlarge and burst (rupture) or blood can flow between the layers of the wall of the aorta through a tear (aorticdissection). Both of these conditions can cause bleeding inside the body and can be life threatening unless diagnosed and treated promptly. CAUSES  The exact cause of an abdominal aortic aneurysm is unknown. Some contributing factors are:   A hardening of the arteries caused by the buildup of fat and other substances in the lining of a blood vessel (arteriosclerosis).  Inflammation of the walls of an artery (arteritis).   Connective tissue diseases, such as Marfan syndrome.   Abdominal trauma.   An infection, such as syphilis or staphylococcus, in the wall of the aorta (infectious aortitis) caused by bacteria. RISK FACTORS  Risk factors that contribute to an abdominal aortic aneurysm may include:  Age older than 60 years.   High blood pressure (hypertension).  Male gender.  Ethnicity (white race).  Obesity.  Family history of aneurysm (first degree relatives only).  Tobacco use. PREVENTION  The following healthy lifestyle habits may help decrease your risk of abdominal aortic aneurysm:  Quitting smoking. Smoking can raise your blood pressure and cause arteriosclerosis.  Limiting or avoiding alcohol.  Keeping your blood pressure, blood sugar level, and cholesterol levels within normal limits.  Decreasing your salt intake. In somepeople, too much salt can raise blood pressure and increase your risk of abdominal aortic aneurysm.  Eating a diet low in saturated fats and cholesterol.  Increasing your fiber intake by including  whole grains, vegetables, and fruits in your diet. Eating these foods may help lower blood pressure.  Maintaining a healthy weight.  Staying physically active and exercising regularly. SYMPTOMS  The symptoms of abdominal aortic aneurysm may vary depending on the size and rate of growth of the aneurysm.Most grow slowly and do not have any symptoms. When symptoms do occur, they may include:  Pain (abdomen, side, lower back, or groin). The pain may vary in intensity. A sudden onset of severe pain may indicate that the aneurysm has ruptured.  Feeling full after eating only small amounts of food.  Nausea or vomiting or both.  Feeling a pulsating lump in the abdomen.  Feeling faint or passing out. DIAGNOSIS  Since most unruptured abdominal aortic aneurysms have no symptoms, they are often discovered during diagnostic exams for other conditions. An aneurysm may be found during the following procedures:  Ultrasonography (A one-time screening for abdominal aortic aneurysm by ultrasonography is also recommended for all men aged 65-75 years who have ever smoked).  X-ray exams.  A computed tomography (CT).  Magnetic resonance imaging (MRI).  Angiography or arteriography. TREATMENT  Treatment of an abdominal aortic aneurysm depends on the size of your aneurysm, your age, and risk factors for rupture. Medication to control blood pressure and pain may be used to manage aneurysms smaller than 6 cm. Regular monitoring for enlargement may be recommended by your caregiver if:  The aneurysm is 3-4 cm in size (an annual ultrasonography may be recommended).  The aneurysm is 4-4.5 cm in size (an ultrasonography every 6 months may be recommended).  The aneurysm is larger than 4.5 cm in   size (your caregiver may ask that you be examined by a vascular surgeon). If your aneurysm is larger than 6 cm, surgical repair may be recommended. There are two main methods for repair of an aneurysm:   Endovascular  repair (a minimally invasive surgery). This is done most often.  Open repair. This method is used if an endovascular repair is not possible. Document Released: 08/08/2005 Document Revised: 02/23/2013 Document Reviewed: 11/28/2012 ExitCare Patient Information 2015 [employer]. This information is not intended to replace advice given to you by your health care provider. Make sure you discuss any questions you have with your health care provider.  

## 2014-09-06 ENCOUNTER — Encounter: Payer: Self-pay | Admitting: Vascular Surgery

## 2014-09-07 ENCOUNTER — Ambulatory Visit (INDEPENDENT_AMBULATORY_CARE_PROVIDER_SITE_OTHER): Payer: Medicare HMO | Admitting: Vascular Surgery

## 2014-09-07 ENCOUNTER — Encounter: Payer: Self-pay | Admitting: Vascular Surgery

## 2014-09-07 ENCOUNTER — Ambulatory Visit
Admission: RE | Admit: 2014-09-07 | Discharge: 2014-09-07 | Disposition: A | Discharge status: Home / Self Care | Payer: Medicare HMO | Source: Ambulatory Visit | Attending: Vascular Surgery | Admitting: Vascular Surgery

## 2014-09-07 ENCOUNTER — Encounter: Payer: Self-pay | Admitting: Cardiology

## 2014-09-07 VITALS — BP 125/73 | HR 83 | Resp 18 | Ht 67.0 in | Wt 159.0 lb

## 2014-09-07 DIAGNOSIS — I714 Abdominal aortic aneurysm, without rupture, unspecified: Secondary | ICD-10-CM

## 2014-09-07 DIAGNOSIS — Z01818 Encounter for other preprocedural examination: Secondary | ICD-10-CM

## 2014-09-07 MED ORDER — IOHEXOL 350 MG/ML SOLN
75.0000 mL | Freq: Once | INTRAVENOUS | Status: AC | PRN
  Administered 2014-09-07: 75 mL via INTRAVENOUS

## 2014-09-07 NOTE — Addendum Note (Signed)
Addended by: Mena Goes on: 09/07/2014 03:23 PM   Modules accepted: Orders

## 2014-09-07 NOTE — Progress Notes (Signed)
Patient ID: Kenneth [name], male   DOB: 15-Mar-1936, 78 y.o.   MRN: [id number]   Kenneth [name], Kenneth [name]  Date of visit:  09/07/2014 DOB:  [birth year]    Age:  78 yrs. Medical record number:  [id number]     Account number:  [id number] Primary Care Provider: Jerene Bears ____________________________ CURRENT DIAGNOSES  1. Dyspnea  2. Atherosclerotic heart disease of native coronary artery without angina pectoris  3. Essential (primary) hypertension  4. Hyperlipidemia, unspecified  5. Presence of prosthetic heart valve  6. Chronic obstructive pulmonary disease, unspecified  7. Abdominal aortic aneurysm, without rupture  8. Personal history of other malignant neoplasm of bronchus and lung  9. Presence of aortocoronary bypass graft  10. Type 2 diabetes mellitus without complications  11. Nonrheumatic aortic (valve) stenosis  12. Presence of coronary angioplasty implant and graft ____________________________ ALLERGIES  Niacin, Intolerance-unknown  Penicillins, Intolerance-unknown ____________________________ MEDICATIONS  1. Cialis 20 mg Tablet, PRN  2. Diovan HCT 320-25 mg tablet, 1/2 tab daily  3. aspirin 81 mg chewable tablet, 1 p.o. daily  4. nitroglycerin 0.4 mg sublingual tablet, PRN  5. Bystolic 5 mg tablet, 1 p.o. daily  6. Crestor 20 mg tablet, 1 p.o. daily  7. omeprazole 40 mg capsule,delayed release, PRN  8. Breo Ellipta 100 mcg-25 mcg/dose powder for inhalation, qd  9. levofloxacin 500 mg tablet, 1 p.o. daily  10. Mucinex 600 mg tablet, extended release, BID ____________________________ CHIEF COMPLAINTS  Followup of Atherosclerotic heart disease of native coronary artery without angina pectoris  Followup of Dyspnea  Followup of Essential (primary) hypertension ____________________________ HISTORY OF PRESENT ILLNESS Patient returns for cardiac followup. He reports that his aneurysm is now 5.1 cm and he will have a CT scan later and see the vascular surgeon today. He continues to  have dyspnea on exertion particularly with walking up stairs or up hills. He can walk about a half mile on level ground without difficulty. He has no angina. He has no PND, orthopnea or significant edema. 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 Major limitation is significant dyspnea due to his underlying lung disease. ____________________________ PAST HISTORY  Past Medical Illnesses:  hypertension, hyperlipidemia, stage 1 non-small cell lung cancer treated with surgery 2/07, AAA-asymptomatic, history of kidney stones, lumbar disc disease, esophageal stricture, COPD, renal cyst;  Cardiovascular Illnesses:  CAD, aortic stenosis;  Surgical Procedures:  cysto x2, lobectomy l lung, tonsillectomy;  NYHA Classification:  II;  Cardiology Procedures-Invasive:  PTCA of the RCA 1996, Cypher Stent RCA September 2003, cardiac cath (right and left) April 2011, AVR (tissue), CABG w LIMA -LAD, SVG-int, SVG-PDA Dr. Roxan Hockey 03/01/10;  Cardiology Procedures-Noninvasive:  echocardiogram October 2010, echocardiogram October 2011, echocardiogram May 2014, echocardiogram January 2015;  Cardiac Cath Results:  scattered irregularities Left main, diffuse luminal irregularities LAD, no significant disease CFX, 90% stenosis distal RCA, 90% stenosis ostial PDA;  Peripheral Vascular Procedures:  MRA Abdomen May 2007, carotid doppler May 2007;  LVEF of 45% documented via echocardiogram on 11/18/2013,   ____________________________ CARDIO-PULMONARY TEST DATES EKG Date:  11/13/2013;   Cardiac Cath Date:  02/16/2010;  CABG: 03/01/2010;  Stent Placement Date: 07/23/2002;  Echocardiography Date: 11/18/2013;  Chest Xray Date: 02/08/2010;  CT Scan Date:  04/02/2006   ____________________________ FAMILY HISTORY Brother -- Brother dead, Renal disorder, Coronary Artery Disease Father -- Father dead, Premature coronary heart disease, Deceased Mother -- Mother dead, Death due to natural cause ____________________________ SOCIAL  HISTORY Alcohol Use:  no alcohol use;  Smoking:  used  to smoke but quit 2007;  Diet:  regular diet without modifications;  Lifestyle:  divorced, remarried and 12 years;  Exercise:  exercise is limited due to physical disability;  Occupation:  retired and works part time [place];  Residence:  lives with wife;   ____________________________ REVIEW OF SYSTEMS General:  malaise and fatigue Eyes: denies diplopia, history of glaucoma or visual problems. Respiratory: dyspnea with exertion, cough Cardiovascular:  please review HPI Abdominal: denies dyspepsia, GI bleeding, constipation, or diarrhea Genitourinary-Male: hesitancy  Musculoskeletal:  arthritis of the wrists Neurological:  occasional headaches  ____________________________ PHYSICAL EXAMINATION VITAL SIGNS  Blood Pressure:  124/70 Sitting, Left arm, regular cuff  , 108/66 Standing, Left arm and regular cuff   Pulse:  76/min. Weight:  157.50 lbs. Height:  67"BMI: 24  Constitutional:  pleasant white male in no acute distress Skin:  healed skin cancer removal lesions Head:  normocephalic, balding male hair pattern Eyes:  EOMS Intact, PERRLA, C and S clear, Funduscopic exam not done. ENT:  upper dentures present Chest:  clear to auscultation, healed thoracotomy scar, healed median sternotomy scar Cardiac:  regular rhythm, normal S1 and S2, no S3 or S4, grade 1/6 systolic murmur at aortic area radiating to neck Peripheral Pulses:  femoral pulses 2+, dorsalis pedis pulses 1+, posterior tibial pulses 1+ Extremities & Back:  well healed saphenous vein donor site RLE Neurological:  no gross motor or sensory deficits noted, affect appropriate, oriented x3. ____________________________ MOST RECENT LIPID PANEL 07/31/13  CHOL TOTL 210 mg/dl, LDL 123 calc, HDL 49 mg/dl, TRIGLYCER 193 mg/dl and CHOL/HDL 4.3 (Calc) ____________________________ IMPRESSIONS/PLAN  1. Coronary artery disease with previous bypass grafting 2. Prosthetic aortic valve  recently evaluated in January 3. Severe COPD 4. Abdominal aneurysm approaching need for intervention 5. Significant dyspnea due to COPD  Recommendations:  Await vascular surgery opinion. From a cardiac viewpoint it is fine for him to proceed with repair percutaneously if needed. Surgical repair would carry more risks. Followup in 6 months. ____________________________ TODAYS ORDERS  1. Return Visit: 6 months  2. 12 Lead EKG: 6 months                       ____________________________ Cardiology Physician:  Kerry Hough MD [facility]

## 2014-09-07 NOTE — Progress Notes (Signed)
Patient name: Kenneth [name] MRN: [id number] DOB: [birth year] Sex: male     Reason for referral:  Chief Complaint  Patient presents with  . AAA    6 month f/u  CT prior    HISTORY OF PRESENT ILLNESS: Here today for further discussion of his infrarenal abdominal aortic aneurysm. Ultrasound has shown continued expansion and most recent study showed a 5.1 cm aneurysm. He underwent a CT scan today for further delineation of this. He is here today for with his wife for discussion. His main limitation is pulmonary with history of COPD. He does have history of coronary disease as well but this is been stable for him. He has no symptoms referable to  Past Medical History  Diagnosis Date  . Abdominal aortic aneurysm without mention of rupture   . Kidney stones 05/03/09    HEMATURIA,CYSTOSCOPY DONE  . CAD (coronary artery disease), native coronary artery 03/10/2014    02/28/10 AVR with tissue valve, Cabg with LIMA to LAD, SVG to int, and SVG to RCA Dr. Roxan Hockey  Cath 02/16/2010 Calcified coronaries  LM calcified, LAD, diffuse disease, circ calcified with 60% mid, RCA calcified with 80% prox and 50% mid  36 mm Peak gradient, severe aortic stenosis  2003 Cypher stent in distal RCA , PTCA of diagonal 1996 PTCA of RCA in 2 places with restenosis treated again   . Hypertensive heart disease   . Diabetes mellitus type 2, noninsulin dependent 03/10/2014  . COPD   . Chronic dyspnea   . Chronic diastolic heart failure   . Lumbar disc disease   . Hyperlipidemia   . History of lung cancer 12/13/2005    LUL lobe Stage 1 non small cell cancer treated with resection Dr. [name]   . GERD 08/06/2008  . Atrial fibrillation   . Cancer 2007    Lung    Past Surgical History  Procedure Laterality Date  . Coronary artery bypass graft  02/2010  . Tissue aortic valve replacement  02/2010  . Tonsillectomy    . Lul resection  12/2005    DR, [name]  Lung Surgery  . Colonoscopy  2008    NORMAL    History    Social History  . Marital Status: Married    Spouse Name: N/A    Number of Children: N/A  . Years of Education: N/A   Occupational History  . Not on file.   Social History Main Topics  . Smoking status: Former Smoker -- 1.00 packs/day for 50 years    Types: Cigarettes    Quit date: 12/13/2005  . Smokeless tobacco: Never Used     Comment: remotely quit smoking  . Alcohol Use: No  . Drug Use: No  . Sexual Activity: Not on file   Other Topics Concern  . 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 Heart disease Father     MI/CABG  . Hyperlipidemia Father   . Hypertension Father   . Heart attack Father     Several attacks  . Cancer Mother     Breast  . Dementia Mother   . Heart disease Brother     Before age 75  . Hyperlipidemia Brother   . Hypertension Brother     Allergies as of 09/07/2014 - Review  Complete 09/07/2014  Allergen Reaction Noted  . Ciprocin-fluocin-procin [fluocinolone] Nausea Only 03/11/2012  . Clarithromycin Nausea Only 03/11/2012  . Hydrocodone-acetaminophen Nausea Only 03/11/2012    Current Outpatient Prescriptions on File Prior to Visit  Medication Sig Dispense Refill  . albuterol (PROVENTIL) (2.5 MG/3ML) 0.083% nebulizer solution continuous as needed.      Marland Kitchen aspirin 81 MG tablet Take by mouth daily.      . clindamycin-benzoyl peroxide (BENZACLIN) gel Apply topically continuous as needed.      . fexofenadine (ALLEGRA) 180 MG tablet Take 180 mg by mouth as needed.       . Indacaterol Maleate (ARCAPTA NEOHALER IN) Inhale into the lungs.      . nebivolol (BYSTOLIC) 5 MG tablet Take 5 mg by mouth daily.      . rosuvastatin (CRESTOR) 10 MG tablet Take 40 mg by mouth daily.       . Testosterone (AXIRON) 30 MG/ACT SOLN Place onto the skin.      Marland Kitchen tiotropium  (SPIRIVA) 18 MCG inhalation capsule Place 18 mcg into inhaler and inhale daily.      . valsartan-hydrochlorothiazide (DIOVAN-HCT) 320-25 MG per tablet Take 1 tablet by mouth daily.       No current facility-administered medications on file prior to visit.      PHYSICAL EXAMINATION:  General: The patient is a well-nourished male, in no acute distress. Vital signs are BP 125/73  Pulse 83  Resp 18  Ht 5\' 7"  (1.702 m)  Wt 159 lb (72.122 kg)  BMI 24.90 kg/m2 Pulmonary: There is a good air exchange  Abdomen: Soft and non-tender . I cannot palpate an aneurysm Musculoskeletal: There are no major deformities.  There is no significant extremity pain. Neurologic: No focal weakness or paresthesias are detected, Skin: There are no ulcer or rashes noted. Psychiatric: The patient has normal affect. Cardiovascular: Palpable femoral pulses bilaterally   [facility] Vascular Lab Studies:  Ultrasound from 10/19 revealed 5.1 cm aneurysm  CT scan today was reviewed and discussed with the patient is wife. This does show a 5.2 cm aneurysm. He does appear to have appropriate anatomy for stent graft repair. He does have some ectasia of his iliac arteries bilaterally but does have an adequate infrarenal aortic neck for proximal fixation.    Impression and Plan:  I very long discussion with the patient and his wife is annual risk for rupture is somewhat less than 5%. I did explain that I would be comfortable either with elective stent graft repair of his aneurysm currently or close follow-up in 6 months with repeat ultrasound. It is slightly below the typical threshold of 5.5 cm. I do feel it is an acceptable operative risk for stent graft repair. He wishes to see [place] again in 6 months. I explained that if he does wish to proceed with elective repair that we would assure cardiac clearance with Dr. Wynonia Lawman. His current inclination is to see [place] again in 6 months with repeat ultrasound. Did explain symptoms of leaking  aneurysm in his report immediately to the emergency room should this occur    EARLY, TODD [facility] Office: [phone]

## 2015-02-21 ENCOUNTER — Encounter: Payer: Self-pay | Admitting: Cardiology

## 2015-02-21 DIAGNOSIS — I447 Left bundle-branch block, unspecified: Secondary | ICD-10-CM | POA: Insufficient documentation

## 2015-02-21 DIAGNOSIS — I5022 Chronic systolic (congestive) heart failure: Secondary | ICD-10-CM | POA: Insufficient documentation

## 2015-02-21 NOTE — Progress Notes (Signed)
Patient ID: HRIDAAN BOUSE, male   DOB: 1936-09-23, 79 y.o.   MRN: [id number]   Demarrio, Menges  Date of visit:  02/21/2015 DOB:  Dec 25, 1935    Age:  79 yrs. Medical record number:  [id number]     Account number:  [id number] Primary Care Provider: Jerene Bears ____________________________ CURRENT DIAGNOSES  1. Chronic systolic heart failure  2. Dyspnea  3. Atherosclerotic heart disease of native coronary artery without angina pectoris  4. Essential (primary) hypertension  5. Left bundle-branch block  6. Hyperlipidemia, unspecified  7. Presence of prosthetic heart valve  8. Chronic obstructive pulmonary disease, unspecified  9. Personal history of other malignant neoplasm of bronchus and lung  10. Abdominal aortic aneurysm, without rupture  11. Type 2 diabetes mellitus without complications  12. Nonrheumatic aortic (valve) stenosis  13. Presence of aortocoronary bypass graft  14. Edema  15. Presence of coronary angioplasty implant and graft ____________________________ ALLERGIES  Niacin, Intolerance-unknown  Penicillins, Intolerance-unknown ____________________________ MEDICATIONS  1. Cialis 20 mg Tablet, PRN  2. Diovan HCT 320-25 mg tablet, 1/2 tab daily  3. aspirin 81 mg chewable tablet, 1 p.o. daily  4. nitroglycerin 0.4 mg sublingual tablet, PRN  5. Bystolic 5 mg tablet, 1 p.o. daily  6. omeprazole 40 mg capsule,delayed release, PRN  7. Mucinex 600 mg tablet, extended release, BID  8. Klor-Con M10 mEq tablet,extended release, PRN  9. finasteride 5 mg tablet, 1 p.o. daily  10. red yeast rice 600 mg tablet, 2 p.o. b.i.d.  11. CoQ-10 100 mg capsule, 2 p.o. daily  12. multivitamin tablet, 1 p.o. daily  13. herbal drugs tablet, sambucus  1 tsp bid  14. furosemide 40 mg tablet, BID ____________________________ HISTORY OF PRESENT ILLNESS Patient returns early for cardiac followup. The echocardiogram showed an ejection fraction of about 35%. His BNP level was elevated at 1000.  Previous BNP levels have all been normal. His furosemide was increased and he feels as if his dyspnea has improved. On reviewing records his ejection fraction prior to aortic valve replacement and cardiac surgery was 60%. He developed a left bundle branch block following his aortic valve replacement and has had a  drop in his ejection fraction since then down to 35%. The echocardiogram showed some septal wall motion abnormality as well as hypokinesis consistent with a bundle branch block. Edema is better and he does still have dyspnea. He has not had any further hemoptysis and is awaiting to see the pulmonary physician. He has no PND or orthopnea. ____________________________ PAST HISTORY  Past Medical Illnesses:  hypertension, hyperlipidemia, stage 1 non-small cell lung cancer treated with surgery 2/07, AAA-asymptomatic, history of kidney stones, lumbar disc disease, esophageal stricture, COPD, renal cyst;  Cardiovascular Illnesses:  CAD, aortic stenosis;  Surgical Procedures:  cysto x2, lobectomy l lung, tonsillectomy;  NYHA Classification:  II;  Canadian Angina Classification:  Class 0: Asymptomatic;  Cardiology Procedures-Invasive:  PTCA of the RCA 1996, Cypher Stent RCA September 2003, cardiac cath (right and left) April 2011, AVR (tissue), CABG w LIMA -LAD, SVG-int, SVG-PDA Dr. Roxan Hockey 03/01/10;  Cardiology Procedures-Noninvasive:  echocardiogram October 2010, echocardiogram October 2011, echocardiogram May 2014, echocardiogram January 2015, echocardiogram April 2016;  Cardiac Cath Results:  scattered irregularities Left main, diffuse luminal irregularities LAD, no significant disease CFX, 90% stenosis distal RCA, 90% stenosis ostial PDA;  Peripheral Vascular Procedures:  MRA Abdomen May 2007, carotid doppler May 2007;  LVEF of 45% documented via echocardiogram on 11/18/2013,   ____________________________ CARDIO-PULMONARY TEST DATES EKG Date:  02/08/2015;   Cardiac Cath Date:  02/16/2010;  CABG:  03/01/2010;  Stent Placement Date: 07/23/2002;  Echocardiography Date: 02/14/2015;  Chest Xray Date: 02/08/2010;  CT Scan Date:  04/02/2006   ____________________________ REVIEW OF SYSTEMS General:  malaise and fatigue, weight loss of approximately 5 lbs Eyes: wears eye glasses/contact lenses, denies diplopia, glaucoma or visual field defects. Respiratory: dyspnea with exertion, cough Cardiovascular:  please review HPI Abdominal: denies dyspepsia, GI bleeding, constipation, or diarrhea Genitourinary-Male: hesitancy  Musculoskeletal:  edema Neurological:  occasional headaches  ____________________________ PHYSICAL EXAMINATION VITAL SIGNS  Blood Pressure:  124/70 Sitting, Left arm, regular cuff  , 120/70 Standing, Left arm and regular cuff   Pulse:  76/min. Weight:  152.00 lbs. Height:  67"BMI: 24  Constitutional:  pleasant white male in no acute distress Skin:  healed skin cancer removal lesions, scattered eccymosis Head:  normocephalic, balding male hair pattern Eyes:  EOMS Intact, PERRLA, C and S clear, Funduscopic exam not done. ENT:  upper dentures present Chest:  clear to auscultation, healed thoracotomy scar, healed median sternotomy scar Cardiac:  regular rhythm, normal S1 and S2, no S3 or S4, grade 1/6 systolic murmur at aortic area radiating to neck Peripheral Pulses:  femoral pulses 2+, dorsalis pedis pulses 1+, posterior tibial pulses 1+ Extremities & Back:  well healed saphenous vein donor site RLE Neurological:  no gross motor or sensory deficits noted, affect appropriate, oriented x3. ____________________________ MOST RECENT LIPID PANEL 07/31/13  CHOL TOTL 210 mg/dl, LDL 123 calc, HDL 49 mg/dl, TRIGLYCER 193 mg/dl and CHOL/HDL 4.3 (Calc) ____________________________ IMPRESSIONS/PLAN  1. Chronic systolic heart failure with cardiomyopathy-one wonders if the left bundle branch block could be contributing 2. Prior aortic valve replacement functioning well by echocardiography 3.  Coronary artery disease with previous bypass grafting 4. Significant COPD and lung disease 5. Hypertension controlled 6. Abdominal aneurysm currently being monitored  Recommendations:  Discussed left bundle branch block and a low ejection fraction. He has now developed some evidence of systolic heart failure and I would like for him to talk to electrophysiologist about the potential role of cardiac resynchronization as well as potential defibrillator. He and his wife were present and we had an extensive discussion about this. We'll obtain a labs today to followup recent increase in diuretic therapy and repeat BNP level. Followup in 6 weeks. ____________________________ TODAYS ORDERS  1. BNP: Today  2. Basic Metabolic Panel: Today  3.  Consult EP: ASAP  4. Return Visit: 6 weeks                       ____________________________ Cardiology Physician:  Kerry Hough MD [facility]

## 2015-03-04 ENCOUNTER — Ambulatory Visit (INDEPENDENT_AMBULATORY_CARE_PROVIDER_SITE_OTHER): Payer: Medicare HMO | Admitting: Internal Medicine

## 2015-03-04 VITALS — Ht 67.0 in | Wt 151.0 lb

## 2015-03-04 DIAGNOSIS — I110 Hypertensive heart disease with heart failure: Secondary | ICD-10-CM | POA: Diagnosis not present

## 2015-03-04 DIAGNOSIS — I509 Heart failure, unspecified: Secondary | ICD-10-CM | POA: Diagnosis not present

## 2015-03-04 NOTE — Progress Notes (Signed)
Marland Kitchen    ELECTROPHYSIOLOGY CONSULT NOTE  Patient ID: ARDEAN MELROY, MRN: [id number], DOB/AGE: Sep 17, 1936 79 y.o. Admit date: (Not on file) Date of Consult: 03/04/2015  Primary Physician: [name]., MD Primary Cardiologist: Judith Part   Chief Complaint: Consideration of an ICD-CRT   HPI Caylen L Willis is a 79 y.o. male with a complex  Cardiac Hx for CAD with stent CABG and bioprosthetic AVR for aortic stenosis in 2011. It was at that time that he had his; most recent cath.  He has an antecedent history of PTCA 1996 and Cypher stenting 2003. Interval chest pain. There has been progressive shortness of breath over the last 3-6 months accompanied by peripheral edema. The latter was addressed by the recent introduction of furosemide with a concomitant improvement in dyspnea. His dyspnea is also in part related to his pulmonary disease which is noted below. He has had some nocturnal dyspnea and nocturnal coughing.  Recent Echo >>EF 35% down from 45% ;  He has been noted to have LBBB. It is also noteworthy that his ejection fraction was 60% prior to his aortic valve replacement at which time his QRS was normal  He has had no palpitations. There has been no syncope.  PMhx also noted for AAA, COPD and hx of [place] lung CA s/p lobectomy  He does not smoke or use recreational drugs. He uses occasional alcohol.   Past Medical History  Diagnosis Date  . Abdominal aortic aneurysm without mention of rupture   . Kidney stones 05/03/09    HEMATURIA,CYSTOSCOPY DONE  . CAD (coronary artery disease), native coronary artery 03/10/2014    02/28/10 AVR with tissue valve, Cabg with LIMA to LAD, SVG to int, and SVG to RCA Dr. Roxan Hockey  Cath 02/16/2010 Calcified coronaries  LM calcified, LAD, diffuse disease, circ calcified with 60% mid, RCA calcified with 80% prox and 50% mid  36 mm Peak gradient, severe aortic stenosis  2003 Cypher stent in distal RCA , PTCA of diagonal 1996 PTCA of RCA in 2 places with  restenosis treated again   . Hypertensive heart disease   . Diabetes mellitus type 2, noninsulin dependent 03/10/2014  . COPD   . Chronic dyspnea   . Chronic diastolic heart failure   . Lumbar disc disease   . Hyperlipidemia   . History of lung cancer 12/13/2005    LUL lobe Stage 1 non small cell cancer treated with resection Dr. [name]   . GERD 08/06/2008  . Atrial fibrillation   . Cancer 2007    Lung      Surgical History:  Past Surgical History  Procedure Laterality Date  . Coronary artery bypass graft  02/2010  . Tissue aortic valve replacement  02/2010  . Tonsillectomy    . Lul resection  12/2005    DR, [name]  Lung Surgery  . Colonoscopy  2008    NORMAL     Home Meds: Prior to Admission medications   Medication Sig Start Date End Date Taking? Authorizing Provider  albuterol (PROVENTIL) (2.5 MG/3ML) 0.083% nebulizer solution continuous as needed. 08/03/13   Historical Provider, MD  aspirin 81 MG tablet Take by mouth daily.    Historical Provider, MD  clindamycin-benzoyl peroxide (BENZACLIN) gel Apply topically continuous as needed. 05/26/13   Historical Provider, MD  fexofenadine (ALLEGRA) 180 MG tablet Take 180 mg by mouth as needed.     Historical Provider, MD  Indacaterol Maleate (ARCAPTA NEOHALER IN) Inhale into the lungs.    Historical Provider, MD  levofloxacin (LEVAQUIN) 500 MG tablet Take 500 mg by mouth daily.    Historical Provider, MD  nebivolol (BYSTOLIC) 5 MG tablet Take 5 mg by mouth daily.    Historical Provider, MD  rosuvastatin (CRESTOR) 10 MG tablet Take 40 mg by mouth daily.     Historical Provider, MD  Testosterone Hinda Kehr) 30 MG/ACT SOLN Place onto the skin.    Historical Provider, MD  tiotropium (SPIRIVA) 18 MCG inhalation capsule Place 18 mcg into inhaler and inhale daily.    Historical Provider, MD  valsartan-hydrochlorothiazide (DIOVAN-HCT) 320-25 MG per tablet Take 1 tablet by mouth daily.    Historical Provider, MD     Allergies:  Allergies    Allergen Reactions  . Ciprocin-Fluocin-Procin [Fluocinolone] Nausea Only  . Clarithromycin Nausea Only    Altered taste  . Hydrocodone-Acetaminophen Nausea Only    History   Social History  . Marital Status: Married    Spouse Name: N/A  . Number of Children: N/A  . Years of Education: N/A   Occupational History  . Not on file.   Social History Main Topics  . Smoking status: Former Smoker -- 1.00 packs/day for 50 years    Types: Cigarettes    Quit date: 12/13/2005  . Smokeless tobacco: Never Used     Comment: remotely quit smoking  . Alcohol Use: No  . Drug Use: No  . Sexual Activity: Not on file   Other Topics Concern  . 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 Heart disease Father     MI/CABG  . Hyperlipidemia Father   . Hypertension Father   . Heart attack Father     Several attacks  . Cancer Mother     Breast  . Dementia Mother   . Heart disease Brother     Before age 97  . Hyperlipidemia Brother   . Hypertension Brother      ROS:  Please see the history of present illness.   He has easy bruisability .  All other systems reviewed and negative.    Physical Exam:    Height 5\' 7"  (1.702 m), weight 151 lb (68.493 kg). General: Well developed, well nourished male in no acute distress. Head: Normocephalic, atraumatic, sclera non-icteric, no xanthomas, nares are without discharge. EENT: normal Lymph Nodes:  none Back: without scoliosis/kyphosis , no CVA tendersness Neck: Negative for carotid bruits. JVD not elevated. Lungs: Clear bilaterally to auscultation without wheezes, rales, or rhonchi. Breathing is unlabored. Heart: RRR with S1 S2. 2/6 systolic murmur , rubs, or gallops appreciated. Abdomen: Soft, non-tender, non-distended with normoactive bowel sounds. No  hepatomegaly. No rebound/guarding. No obvious abdominal masses. Msk:  Strength and tone appear normal for age. Extremities: No clubbing or cyanosis. No edema.  Distal pedal pulses are 2+ and equal bilaterally. Skin: Warm and Dry Neuro: Alert and oriented X 3. CN III-XII intact Grossly normal sensory and motor function . Psych:  Responds to questions appropriately with a normal affect.      Labs: Cardiac Enzymes No results for input(s): CKTOTAL, CKMB, TROPONINI in the last 72 hours. CBC Lab Results  Component Value Date   WBC 10.1 03/05/2010   HGB 10.2* 03/05/2010   HCT 28.6* 03/05/2010   MCV 93.0 03/05/2010   PLT 168 03/05/2010   PROTIME: No  results for input(s): LABPROT, INR in the last 72 hours. Chemistry No results for input(s): NA, K, CL, CO2, BUN, CREATININE, CALCIUM, PROT, BILITOT, ALKPHOS, ALT, AST, GLUCOSE in the last 168 hours.  Invalid input(s): LABALBU Lipids No results found for: CHOL, HDL, LDLCALC, TRIG BNP PRO B NATRIURETIC PEPTIDE (BNP)  Date/Time Value Ref Range Status  07/09/2008 12:15 PM 36.0 0.0-100.0 pg/mL Final   Thyroid Function Tests: No results for input(s): TSH, T4TOTAL, T3FREE, THYROIDAB in the last 72 hours.  Invalid input(s): FREET3    Miscellaneous No results found for: DDIMER  Radiology/Studies:  No results found.  EKG: Obtained from Dr. Thurman Coyer office demonstrated left bundle branch block and sinus rhythm. The rate was 80   intervals 13/17/46. This was obtained by fax because the ECG in our office showed a right axis deviation which I suspected was related to limb lead reversal. QRS duration was 150 ms.   Assessment and Plan:   Ischemic cardiomyopathy with prior bypass and ejection fraction of 35%  and aortic valve replacement  Left bundle branch block QRS duration 150 ms  Congestive heart failure-class IIb-IIIa  COPD/history of non-small cell lung cancer with left lobectomy  The patient has congestive heart failure and left  ventricular dysfunction which has been progressive in the context of left bundle branch block. It is intriguing to asked the question as to whether the left bundle branch block temporally related to his aortic valve surgery has been the cause of deteriorating LV function as has been recently described. In any case, it is reasonable to consider ICD implantation for primary prevention in the context of left ventricular dysfunction and cardiac resynchronization given his congestive symptoms. We discussed also the potential role of resynchronization given his left bundle branch block.  I wonder however, whether, given the interlude of 5 years since his revascularization, whether it is not appropriate to pursue evaluation of his coronary anatomy prior to proceeding. Clearly, if progressive coronary disease revascularization would be the appropriate next step as opposed to device implantation. This was discussed with Dr. Wynonia Lawman. He will pursue Myoview scanning   [name]

## 2015-03-14 ENCOUNTER — Institutional Professional Consult (permissible substitution): Payer: Medicare HMO | Admitting: Internal Medicine

## 2015-03-15 ENCOUNTER — Other Ambulatory Visit (HOSPITAL_COMMUNITY): Payer: Medicare HMO

## 2015-03-15 ENCOUNTER — Ambulatory Visit: Payer: Medicare HMO | Admitting: Vascular Surgery

## 2015-03-28 ENCOUNTER — Telehealth: Payer: Self-pay | Admitting: Internal Medicine

## 2015-03-28 NOTE — Telephone Encounter (Signed)
New message      Pt had a stress test by Dr Wynonia Lawman.  He said that Dr Wynonia Lawman is going to call Dr Caryl Comes regarding him getting a defibulator/pacemaker.  He is calling to see if this has been scheduled or if the two doctors have discussed this case

## 2015-03-28 NOTE — Telephone Encounter (Signed)
Pt states he had treadmill at Dr Thurman Coyer office last Thursday.   Pt states Dr Wynonia Lawman was going to talk with Dr Caryl Comes about scheduling an appointment with Dr Caryl Comes.

## 2015-03-28 NOTE — Telephone Encounter (Signed)
i was out of town,  Can we get copy of stress test and then we can schedule the pt wither to come in and review the procedure (M can you call please?) and if yes, set up OV and if not< Sherri, can you schedule procedure please

## 2015-03-28 NOTE — Telephone Encounter (Signed)
Pt advised I do not see appointment scheduled with Dr Caryl Comes.  Pt advised Dr Caryl Comes in office tomorrow afternoon and  I will forward to Dr Caryl Comes for review and follow up.

## 2015-03-29 ENCOUNTER — Other Ambulatory Visit (HOSPITAL_COMMUNITY): Payer: Medicare HMO

## 2015-03-29 ENCOUNTER — Ambulatory Visit: Payer: Medicare HMO | Admitting: Vascular Surgery

## 2015-04-07 ENCOUNTER — Encounter: Payer: Self-pay | Admitting: *Deleted

## 2015-04-07 ENCOUNTER — Ambulatory Visit (INDEPENDENT_AMBULATORY_CARE_PROVIDER_SITE_OTHER): Payer: Medicare HMO | Admitting: Internal Medicine

## 2015-04-07 ENCOUNTER — Encounter: Payer: Self-pay | Admitting: Internal Medicine

## 2015-04-07 VITALS — BP 144/86 | HR 68 | Ht 67.0 in | Wt 155.2 lb

## 2015-04-07 DIAGNOSIS — I447 Left bundle-branch block, unspecified: Secondary | ICD-10-CM | POA: Diagnosis not present

## 2015-04-07 DIAGNOSIS — R9439 Abnormal result of other cardiovascular function study: Secondary | ICD-10-CM | POA: Diagnosis not present

## 2015-04-07 DIAGNOSIS — Z01812 Encounter for preprocedural laboratory examination: Secondary | ICD-10-CM | POA: Diagnosis not present

## 2015-04-07 DIAGNOSIS — I5030 Unspecified diastolic (congestive) heart failure: Secondary | ICD-10-CM

## 2015-04-07 LAB — CBC WITH DIFFERENTIAL/PLATELET
BASOS PCT: 0.2 % (ref 0.0–3.0)
Basophils Absolute: 0 10*3/uL (ref 0.0–0.1)
Eosinophils Absolute: 0 10*3/uL (ref 0.0–0.7)
Eosinophils Relative: 0.1 % (ref 0.0–5.0)
HEMATOCRIT: 44.2 % (ref 39.0–52.0)
Hemoglobin: 15 g/dL (ref 13.0–17.0)
LYMPHS PCT: 10 % — AB (ref 12.0–46.0)
Lymphs Abs: 1.1 10*3/uL (ref 0.7–4.0)
MCHC: 33.9 g/dL (ref 30.0–36.0)
MCV: 93.6 fl (ref 78.0–100.0)
Monocytes Absolute: 0.3 10*3/uL (ref 0.1–1.0)
Monocytes Relative: 2.7 % — ABNORMAL LOW (ref 3.0–12.0)
NEUTROS ABS: 10 10*3/uL — AB (ref 1.4–7.7)
Neutrophils Relative %: 87 % — ABNORMAL HIGH (ref 43.0–77.0)
PLATELETS: 224 10*3/uL (ref 150.0–400.0)
RBC: 4.72 Mil/uL (ref 4.22–5.81)
RDW: 13.5 % (ref 11.5–15.5)
WBC: 11.4 10*3/uL — AB (ref 4.0–10.5)

## 2015-04-07 LAB — BASIC METABOLIC PANEL
BUN: 22 mg/dL (ref 6–23)
CO2: 27 mEq/L (ref 19–32)
Calcium: 9.8 mg/dL (ref 8.4–10.5)
Chloride: 99 mEq/L (ref 96–112)
Creatinine, Ser: 1.07 mg/dL (ref 0.40–1.50)
GFR: 71.31 mL/min (ref >60.00)
Glucose, Bld: 152 mg/dL — ABNORMAL HIGH (ref 70–99)
POTASSIUM: 4.2 meq/L (ref 3.5–5.1)
Sodium: 134 mEq/L — ABNORMAL LOW (ref 135–145)

## 2015-04-07 NOTE — Progress Notes (Signed)
Electrophysiology Office Note   Date:  04/07/2015   ID:  Kenneth [name], DOB August 23, 1936, MRN [id number]  PCP:  [name]., MD  Cardiologist:  [facility] Primary Electrophysiologist:   [name], MD    No chief complaint on file.    History of Present Illness: Kenneth [name] is a 79 y.o. male   Seen in followup for consideration of CRT he has hx of ischemic cardiomyopathy with prior bypass and ejection fraction of 35% and aortic valve replacement with Left bundle branch block QRS duration 150 ms And Congestive heart failure-class IIb-IIIa.;  In some time since his last evaluation for coronary artery disease. He underwent Myoview scanning by Dr. Wynonia Lawman which demonstrated no ischemia.  He also has hx of COPD/history of non-small cell lung cancer with left lobectomy   He has problems with shortness of breath and peripheral edema as well as orthopnea.  Today, he denies symptoms of  chest pain, s , bleeding, or neurologic sequela.  he has no  complaints of palpitations, lightheadedness presyncope or syncope  The patient is tolerating medications without difficulties and is otherwise without complaint today.    Past Medical History  Diagnosis Date  . Abdominal aortic aneurysm without mention of rupture   . Kidney stones 05/03/09    HEMATURIA,CYSTOSCOPY DONE  . CAD (coronary artery disease), native coronary artery 03/10/2014    02/28/10 AVR with tissue valve, Cabg with LIMA to LAD, SVG to int, and SVG to RCA Dr. Roxan Hockey  Cath 02/16/2010 Calcified coronaries  LM calcified, LAD, diffuse disease, circ calcified with 60% mid, RCA calcified with 80% prox and 50% mid  36 mm Peak gradient, severe aortic stenosis  2003 Cypher stent in distal RCA , PTCA of diagonal 1996 PTCA of RCA in 2 places with restenosis treated again   . Hypertensive heart disease   . Diabetes mellitus type 2, noninsulin dependent 03/10/2014  . COPD   . Chronic dyspnea   . Chronic diastolic heart failure   .  Lumbar disc disease   . Hyperlipidemia   . History of lung cancer 12/13/2005    LUL lobe Stage 1 non small cell cancer treated with resection Dr. [name]   . GERD 08/06/2008  . Atrial fibrillation   . Cancer 2007    Lung   Past Surgical History  Procedure Laterality Date  . Coronary artery bypass graft  02/2010  . Tissue aortic valve replacement  02/2010  . Tonsillectomy    . Lul resection  12/2005    DR, [name]  Lung Surgery  . Colonoscopy  2008    NORMAL     Current Outpatient Prescriptions  Medication Sig Dispense Refill  . albuterol (PROVENTIL) (2.5 MG/3ML) 0.083% nebulizer solution Take 2.5 mg by nebulization every 6 (six) hours as needed for wheezing or shortness of breath.     Marland Kitchen aspirin 81 MG tablet Take 81 mg by mouth daily.     . cefUROXime (CEFTIN) 500 MG tablet Take 1 tablet by mouth 2 (two) times daily.    . fexofenadine (ALLEGRA) 180 MG tablet Take 180 mg by mouth daily as needed for allergies.     . finasteride (PROSCAR) 5 MG tablet Take 5 mg by mouth daily.     . furosemide (LASIX) 40 MG tablet Take 40 mg by mouth 2 (two) times daily.     . nebivolol (BYSTOLIC) 5 MG tablet Take 5 mg by mouth daily.    Marland Kitchen omeprazole (PRILOSEC) 40 MG capsule Take  40 mg by mouth daily.     . potassium chloride (K-DUR,KLOR-CON) 10 MEQ tablet Take 10 mEq by mouth every other day.     . predniSONE (STERAPRED UNI-PAK 21 TAB) 5 MG (21) TBPK tablet Take as directed    . valsartan-hydrochlorothiazide (DIOVAN-HCT) 320-25 MG per tablet Take 0.5 tablets by mouth daily.     No current facility-administered medications for this visit.    Allergies:   Ciprocin-fluocin-procin; Clarithromycin; and Hydrocodone-acetaminophen   Social History:  The patient  reports that he quit smoking about 9 years ago. His smoking use included Cigarettes. He has a 50 pack-year smoking history. He has never used smokeless tobacco. He reports that he does not drink alcohol or use illicit drugs.   Family History:  The  patient's family history includes Cancer in his mother; Dementia in his mother; Heart attack in his father; Heart disease in his brother and father; Hyperlipidemia in his brother and father; Hypertension in his brother and father.    ROS:  Please see the history of present illness.   Otherwise, review of systems is negative .    PHYSICAL EXAM: VS:  BP 144/86 mmHg  Pulse 68  Ht 5\' 7"  (1.702 m)  Wt 155 lb 3.2 oz (70.398 kg)  BMI 24.30 kg/m2 , BMI Body mass index is 24.3 kg/(m^2). GEN: Well nourished, well developed, in no acute distress HEENT: normal Neck: JVD 6-7, carotid bruits, or masses Cardiac: R RR; no murmur  rubs,  noS4  Respiratory:  clear to auscultation bilaterally, normal work of breathing Back without kyphosis or CVAT GI: soft, nontender, nondistended, + BS MS: no deformity or atrophy Skin: warm and dry,   Extremities No Clubbing cyanosis 1 Edema Neuro:  Strength and sensation are intact Psych: euthymic mood, full affect  EKG:  EKG is ordered today. The ekg ordered today shows sinus rhythm at 68 Intervals 13/15/44 LBBB   Recent Labs: No results found for requested labs within last 365 days.    Lipid Panel  No results found for: CHOL, TRIG, HDL, CHOLHDL, VLDL, LDLCALC, LDLDIRECT   Wt Readings from Last 3 Encounters:  04/07/15 155 lb 3.2 oz (70.398 kg)  03/04/15 151 lb (68.493 kg)  09/07/14 159 lb (72.122 kg)      Other studies Reviewed: Additional studies/ records that were reviewed today include: myoview   Review of the above records today demonstrates: As above    ASSESSMENT AND PLAN: Ischemic cardiomyopathy with prior bypass and ejection fraction of 35% and aortic valve replacement  Left bundle branch block QRS duration 150 ms  Congestive heart failure-class IIb-IIIa  COPD/history of non-small cell lung cancer with left lobectomy  Have reviewed the potential benefits and risks of ICD implantation including but not limited to death, perforation  of heart or lung, lead dislodgement, infection,  device malfunction and inappropriate shocks.  The patient and family *express understanding  and are willing to proceed.     Current medicines are reviewed at length with the patient today.   The patient does not have concerns regarding his medicines.  The following changes were made today:  none  Labs/ tests ordered today include:    No orders of the defined types were placed in this encounter.     Disposition:  proeed with CRT implantation    Signed, [name], MD  04/07/2015 8:23 AM     [address] [place] [address] [phone] (office) [phone] (fax)

## 2015-04-07 NOTE — Patient Instructions (Signed)
Medication Instructions:  Your physician recommends that you continue on your current medications as directed. Please refer to the Current Medication list given to you today.  Labwork: Pre procedure lab work today  Testing/Procedures: Your physician has recommended that you have a defibrillator inserted. An implantable cardioverter defibrillator (ICD) is a small device that is placed in your chest or, in rare cases, your abdomen. This device uses electrical pulses or shocks to help control life-threatening, irregular heartbeats that could lead the heart to suddenly stop beating (sudden cardiac arrest). Leads are attached to the ICD that goes into your heart. This is done in the hospital and usually requires an overnight stay. Please see the instruction sheet given to you today for more information.  Follow-Up: Your wound check is scheduled for 04/25/15 at 9:00 a.m. at [address].   Thank you for choosing [facility]!!     Cardioverter Defibrillator Implantation An implantable cardioverter defibrillator (ICD) is a small, lightweight, battery-powered device that is placed (implanted) under the skin in the chest or abdomen. Your caregiver may prescribe an ICD if:  You have had an irregular heart rhythm (arrhythmia) that originated in the lower chambers of the heart (ventricles).  Your heart has been damaged by a disease (such as coronary artery disease) or heart condition (such as a heart attack). An ICD consists of a battery that lasts several years, a small computer called a pulse generator, and wires called leads that go into the heart. It is used to detect and correct two dangerous arrhythmias: a rapid heart rhythm (tachycardia) and an arrhythmia in which the ventricles contract in an uncoordinated way (fibrillation). When an ICD detects tachycardia, it sends an electrical signal to the heart that restores the heartbeat to normal (cardioversion). This signal is usually  painless. If cardioversion does not work or if the ICD detects fibrillation, it delivers a small electrical shock to the heart (defibrillation) to restart the heart. The shock may feel like a strong jolt in the chest.ICDs may be programmed to correct other problems. Sometimes, ICDs are programmed to act as another type of implantable device called a pacemaker. Pacemakers are used to treat a slow heartbeat (bradycardia). LET YOUR CAREGIVER KNOW ABOUT:  Any allergies you have.  All medicines you are taking, including vitamins, herbs, eyedrops, and over-the-counter medicines and creams.  Previous problems you or members of your family have had with the use of anesthetics.  Any blood disorders you have had.  Other health problems you have. RISKS AND COMPLICATIONS Generally, the procedure to implant an ICD is safe. However, as with any surgical procedure, complications can occur. Possible complications associated with implanting an ICD include:  Swelling, bleeding, or bruising at the site where the ICD was implanted.  Infection at the site where the ICD was implanted.  A reaction to medicine used during the procedure.  Nerve, heart, or blood vessel damage.  Blood clots. BEFORE THE PROCEDURE  You may need to have blood tests, heart tests, or a chest X-ray done before the day of the procedure.  Ask your caregiver about changing or stopping your regular medicines.  Make plans to have someone drive you home. You may need to stay in the hospital overnight after the procedure.  Stop smoking at least 24 hours before the procedure.  Take a bath or shower the night before the procedure. You may need to scrub your chest or abdomen with a special type of soap.  Do not eat or drink  before your procedure for as long as directed by your caregiver. Ask if it is okay to take any needed medicine with a small sip of water. PROCEDURE  The procedure to implant an ICD in your chest or abdomen is usually  done at a hospital in a room that has a large X-ray machine called a fluoroscope. The machine will be above you during the procedure. It will help your caregiver see your heart during the procedure. Implanting an ICD usually takes 1-3 hours. Before the procedure:   Small monitors will be put on your body. They will be used to check your heart, blood pressure, and oxygen level.  A needle will be put into a vein in your hand or arm. This is called an intravenous (IV) access tube. Fluids and medicine will flow directly into your body through the IV tube.  Your chest or abdomen will be cleaned with a germ-killing (antiseptic) solution. The area may be shaved.  You may be given medicine to help you relax (sedative).  You will be given a medicine called a local anesthetic. This medicine will make the surgical site numb while the ICD is implanted. You will be sleepy but awake during the procedure. After you are numb the procedure will begin. The caregiver will:  Make a small cut (incision). This will make a pocket deep under your skin that will hold the pulse generator.  Guide the leads through a large blood vessel into your heart and attach them to the heart muscles. Depending on the ICD, the leads may go into one ventricle or they may go to both ventricles and into an upper chamber of the heart (atrium).  Test the ICD.  Close the incision with stitches, glue, or staples. AFTER THE PROCEDURE  You may feel pain. Some pain is normal. It may last a few days.  You may stay in a recovery area until the local anesthetic has worn off. Your blood pressure and pulse will be checked often. You will be taken to a room where your heart will be monitored.  A chest X-ray will be taken. This is done to check that the cardioverter defibrillator is in the right place.  You may stay in the hospital overnight.  A slight bump may be seen over the skin where the ICD was placed. Sometimes, it is possible to feel  the ICD under the skin. This is normal.  In the months and years afterward, your caregiver will check the device, the leads, and the battery every few months. Eventually, when the battery is low, the ICD will be replaced. Document Released: 07/21/2002 Document Revised: 08/19/2013 Document Reviewed: 11/17/2012 [facility] Patient Information 2015 [place], [place]. This information is not intended to replace advice given to you by your health care provider. Make sure you discuss any questions you have with your health care provider.    Cardioverter Defibrillator Implantation, Care After Refer to this sheet in the next few weeks. These instructions provide you with information on caring for yourself after your procedure. Your health care provider may also give you more specific instructions. Your treatment has been planned according to current medical practices, but problems sometimes occur. Call your health care provider if you have any problems or questions after your procedure.  WHAT TO EXPECT AFTER THE PROCEDURE  You may feel pain. Some pain is normal. It may last a few days.  A slight bump may be seen over the skin where the device was placed. Sometimes, it is  possible to feel the device under the skin. This is normal.  In the months and years afterward, your health care provider will check the device, the leads, and the battery every few months. Eventually, when the battery is low, the device will be replaced. HOME CARE INSTRUCTIONS  Medicines  Take medicines only as directed by your health care provider.  If you were prescribed an antibiotic medicine, finish it all even if you start to feel better.   Do not take any other medicines without asking your health care provider first. Some medicines, including certain painkillers, can cause bleeding after surgery.  Wound Care  Do not remove the bandage on your chest until directed to do so by your health care provider.  Once your bandage is  removed, you may see pieces of tape called skin adhesive strips over the area where the cut was made (incision site). Let them fall off on their own.   Check the incision site every day to make sure it is not infected, bleeding, or starting to pull apart.  Do not use lotions or ointments near the incision site unless directed to do so.   Keep the incision area clean and dry for 2-3 days after the procedure or as directed by your health care provider. It takes several weeks for the incision site to completely heal.   Do not take baths, swim, or use a hot tub until your health care provider approves. Activities  Try to walk a little every day. Exercising is important after this procedure. It is also important to use your shoulder on the side of the pacemaker in daily tasks that do not require exaggerated motion.  Avoid sudden jerking, pulling, or chopping movements that pull your upper arm far away from your body for at least 6 weeks.  Do not lift your upper arm above your shoulders for at least 6 weeks. This means no tennis, golf, or swimming for this period of time. If you sleep with the arm above your head, use a restraint to prevent this from happening as you sleep.  You may go back to work when your health care provider says it is okay. Check with your health care provider before you start to drive or play sports.  Other Instructions  Follow diet instructions if they were provided. You should be able to eat what you usually do right away, but you may need to limit your salt intake.   Weigh yourself every day. If you suddenly gain weight, fluid may be building up in your body.   Always carry your pacemaker identification card with you. The card should list the implant date, device model, and manufacturer. Consider wearing a medical alert bracelet or necklace.  Tell all health care providers that you have a pacemaker. This may prevent them from giving you a magnetic resonance  imaging (MRI) scan because of the strong magnets used during that test.  If you must pass through a metal detector, quickly walk through it. Do not stop under the detector or stand near it.  Avoid places or objects with a strong electric or magnetic field, including:   Engineer, maintenance. When at the airport, let officials know you have a pacemaker. Your ID card will let you be checked in a way that is safe for you and that will not damage your pacemaker. Also, do not let a security person wave a magnetic wand near your pacemaker. That can make it stop working.  Power plants.  Large electrical generators.   Radiofrequency transmission towers, such as cell phone and radio towers.   Do not use amateur (ham) radio equipment or electric (arc) welding torches. Some devices are safe to use if held at least 1 foot from your pacemaker. These include power tools, lawn mowers, and speakers. If you are unsure of whether something is safe to use, ask your health care provider.   You may safely use electric blankets, heating pads, computers, and microwave ovens.   When using your cell phone, hold it to the ear opposite the pacemaker. Do not leave your cell phone in a pocket over the pacemaker.   Keep all follow-up visits as directed by your health care provider. This is how your health care provider makes sure your chest is healing the way it should. Ask your health care provider when you should come back to have your stitches or staples taken out.   Have your pacemaker checked every 3-6 months or as directed by your health care provider. Most pacemakers last for 4-8 years before a new one is needed. SEEK MEDICAL CARE IF:   You feel one shock in your chest.  You gain weight suddenly.   Your legs or feet swell more than they have before.   It feels like your heart is fluttering or skipping beats (heart palpitations).  You have a fever. SEEK IMMEDIATE MEDICAL CARE IF:   You have  chest pain.  You feel more than one shock.  You feel more short of breath than you have felt before.  You feel more light-headed than you have felt before.  You have problems with your incision site, such as swelling or bleeding, or it starts to open up.   You notice signs of infection around your incision site. Watch for:   Warmth.   Redness.   Worsening pain.   Swelling.   Fluid leaking from the incision site.  Document Released: 05/18/2005 Document Revised: 03/15/2014 Document Reviewed: 11/19/2012 [facility] Patient Information 2015 [place], [place]. This information is not intended to replace advice given to you by your health care provider. Make sure you discuss any questions you have with your health care provider.

## 2015-04-12 MED ORDER — CEFAZOLIN SODIUM-DEXTROSE 2-3 GM-% IV SOLR: 2.0000 g | INTRAVENOUS | Status: DC

## 2015-04-12 MED ORDER — SODIUM CHLORIDE 0.9 % IR SOLN
80.0000 mg | Status: DC
  Filled 2015-04-12: qty 2

## 2015-04-13 ENCOUNTER — Ambulatory Visit (HOSPITAL_COMMUNITY)
Admission: RE | Admit: 2015-04-13 | Discharge: 2015-04-14 | Disposition: A | Discharge status: Home / Self Care | Payer: Medicare HMO | Source: Ambulatory Visit | Attending: Internal Medicine | Admitting: Internal Medicine

## 2015-04-13 ENCOUNTER — Encounter (HOSPITAL_COMMUNITY)
Admission: RE | Disposition: A | Discharge status: Home / Self Care | Payer: Medicare HMO | Source: Ambulatory Visit | Attending: Internal Medicine

## 2015-04-13 DIAGNOSIS — Z951 Presence of aortocoronary bypass graft: Secondary | ICD-10-CM | POA: Insufficient documentation

## 2015-04-13 DIAGNOSIS — I447 Left bundle-branch block, unspecified: Secondary | ICD-10-CM | POA: Diagnosis present

## 2015-04-13 DIAGNOSIS — Z902 Acquired absence of lung [part of]: Secondary | ICD-10-CM | POA: Insufficient documentation

## 2015-04-13 DIAGNOSIS — I255 Ischemic cardiomyopathy: Secondary | ICD-10-CM | POA: Insufficient documentation

## 2015-04-13 DIAGNOSIS — I428 Other cardiomyopathies: Secondary | ICD-10-CM

## 2015-04-13 DIAGNOSIS — I5022 Chronic systolic (congestive) heart failure: Secondary | ICD-10-CM | POA: Diagnosis not present

## 2015-04-13 DIAGNOSIS — Z87891 Personal history of nicotine dependence: Secondary | ICD-10-CM | POA: Diagnosis not present

## 2015-04-13 DIAGNOSIS — Z955 Presence of coronary angioplasty implant and graft: Secondary | ICD-10-CM | POA: Insufficient documentation

## 2015-04-13 DIAGNOSIS — Z79899 Other long term (current) drug therapy: Secondary | ICD-10-CM | POA: Diagnosis not present

## 2015-04-13 DIAGNOSIS — E119 Type 2 diabetes mellitus without complications: Secondary | ICD-10-CM | POA: Diagnosis not present

## 2015-04-13 DIAGNOSIS — J449 Chronic obstructive pulmonary disease, unspecified: Secondary | ICD-10-CM | POA: Insufficient documentation

## 2015-04-13 DIAGNOSIS — Z95810 Presence of automatic (implantable) cardiac defibrillator: Secondary | ICD-10-CM | POA: Diagnosis not present

## 2015-04-13 DIAGNOSIS — I429 Cardiomyopathy, unspecified: Secondary | ICD-10-CM | POA: Diagnosis not present

## 2015-04-13 DIAGNOSIS — Z952 Presence of prosthetic heart valve: Secondary | ICD-10-CM | POA: Diagnosis not present

## 2015-04-13 DIAGNOSIS — I2510 Atherosclerotic heart disease of native coronary artery without angina pectoris: Secondary | ICD-10-CM | POA: Insufficient documentation

## 2015-04-13 DIAGNOSIS — Z7982 Long term (current) use of aspirin: Secondary | ICD-10-CM | POA: Insufficient documentation

## 2015-04-13 DIAGNOSIS — Z87442 Personal history of urinary calculi: Secondary | ICD-10-CM | POA: Diagnosis not present

## 2015-04-13 DIAGNOSIS — Z01812 Encounter for preprocedural laboratory examination: Secondary | ICD-10-CM

## 2015-04-13 DIAGNOSIS — Z959 Presence of cardiac and vascular implant and graft, unspecified: Secondary | ICD-10-CM

## 2015-04-13 DIAGNOSIS — R000 Tachycardia, unspecified: Secondary | ICD-10-CM | POA: Diagnosis present

## 2015-04-13 DIAGNOSIS — Z85118 Personal history of other malignant neoplasm of bronchus and lung: Secondary | ICD-10-CM | POA: Diagnosis not present

## 2015-04-13 DIAGNOSIS — I509 Heart failure, unspecified: Secondary | ICD-10-CM | POA: Diagnosis not present

## 2015-04-13 DIAGNOSIS — K219 Gastro-esophageal reflux disease without esophagitis: Secondary | ICD-10-CM | POA: Insufficient documentation

## 2015-04-13 DIAGNOSIS — I4891 Unspecified atrial fibrillation: Secondary | ICD-10-CM | POA: Insufficient documentation

## 2015-04-13 DIAGNOSIS — E785 Hyperlipidemia, unspecified: Secondary | ICD-10-CM | POA: Insufficient documentation

## 2015-04-13 DIAGNOSIS — I5030 Unspecified diastolic (congestive) heart failure: Secondary | ICD-10-CM

## 2015-04-13 DIAGNOSIS — R9439 Abnormal result of other cardiovascular function study: Secondary | ICD-10-CM

## 2015-04-13 HISTORY — PX: EP IMPLANTABLE DEVICE: SHX172B

## 2015-04-13 HISTORY — DX: Presence of automatic (implantable) cardiac defibrillator: Z95.810

## 2015-04-13 HISTORY — DX: Left bundle-branch block, unspecified: I44.7

## 2015-04-13 LAB — SURGICAL PCR SCREEN
MRSA, PCR: NEGATIVE
Staphylococcus aureus: NEGATIVE

## 2015-04-13 SURGERY — BIV ICD INSERTION CRT-D

## 2015-04-13 MED ORDER — IRBESARTAN 150 MG PO TABS
150.0000 mg | ORAL_TABLET | Freq: Every day | ORAL | Status: DC
  Administered 2015-04-13 – 2015-04-14 (×2): 150 mg via ORAL
  Filled 2015-04-13 (×2): qty 1

## 2015-04-13 MED ORDER — ONDANSETRON HCL 4 MG/2ML IJ SOLN: 4.0000 mg | Freq: Four times a day (QID) | INTRAMUSCULAR | Status: DC | PRN

## 2015-04-13 MED ORDER — CEFUROXIME AXETIL 500 MG PO TABS
500.0000 mg | ORAL_TABLET | Freq: Two times a day (BID) | ORAL | Status: DC
  Filled 2015-04-13: qty 1

## 2015-04-13 MED ORDER — VALSARTAN-HYDROCHLOROTHIAZIDE 320-25 MG PO TABS: 0.5000 | ORAL_TABLET | Freq: Every day | ORAL | Status: DC

## 2015-04-13 MED ORDER — MUPIROCIN 2 % EX OINT
1.0000 "application " | TOPICAL_OINTMENT | Freq: Once | CUTANEOUS | Status: AC
  Administered 2015-04-13: 1 via TOPICAL

## 2015-04-13 MED ORDER — POTASSIUM CHLORIDE CRYS ER 10 MEQ PO TBCR
10.0000 meq | EXTENDED_RELEASE_TABLET | ORAL | Status: DC
  Administered 2015-04-14: 10 meq via ORAL
  Filled 2015-04-13: qty 1

## 2015-04-13 MED ORDER — MIDAZOLAM HCL 5 MG/5ML IJ SOLN
INTRAMUSCULAR | Status: DC | PRN
  Administered 2015-04-13 (×3): 1 mg via INTRAVENOUS

## 2015-04-13 MED ORDER — PANTOPRAZOLE SODIUM 40 MG PO TBEC
40.0000 mg | DELAYED_RELEASE_TABLET | Freq: Every day | ORAL | Status: DC
  Administered 2015-04-14: 40 mg via ORAL
  Filled 2015-04-13: qty 1

## 2015-04-13 MED ORDER — HEPARIN (PORCINE) IN NACL 2-0.9 UNIT/ML-% IJ SOLN
INTRAMUSCULAR | Status: AC
  Filled 2015-04-13: qty 500

## 2015-04-13 MED ORDER — SODIUM CHLORIDE 0.9 % IV SOLN
INTRAVENOUS | Status: DC
Start: 2015-04-13 — End: 2015-04-13
  Administered 2015-04-13: 12:00:00 via INTRAVENOUS

## 2015-04-13 MED ORDER — ASPIRIN 81 MG PO TABS: 81.0000 mg | ORAL_TABLET | Freq: Every day | ORAL | Status: DC

## 2015-04-13 MED ORDER — ACETAMINOPHEN 325 MG PO TABS
325.0000 mg | ORAL_TABLET | ORAL | Status: DC | PRN
  Administered 2015-04-13 – 2015-04-14 (×2): 650 mg via ORAL
  Administered 2015-04-14: 325 mg via ORAL
  Filled 2015-04-13 (×3): qty 2

## 2015-04-13 MED ORDER — CHLORHEXIDINE GLUCONATE 4 % EX LIQD: 60.0000 mL | Freq: Once | CUTANEOUS | Status: DC

## 2015-04-13 MED ORDER — NEBIVOLOL HCL 5 MG PO TABS
5.0000 mg | ORAL_TABLET | Freq: Every day | ORAL | Status: DC
  Administered 2015-04-13 – 2015-04-14 (×2): 5 mg via ORAL
  Filled 2015-04-13 (×2): qty 1

## 2015-04-13 MED ORDER — HYDROCHLOROTHIAZIDE 12.5 MG PO CAPS
12.5000 mg | ORAL_CAPSULE | Freq: Every day | ORAL | Status: DC
  Administered 2015-04-13 – 2015-04-14 (×2): 12.5 mg via ORAL
  Filled 2015-04-13 (×2): qty 1

## 2015-04-13 MED ORDER — FENTANYL CITRATE (PF) 100 MCG/2ML IJ SOLN
INTRAMUSCULAR | Status: AC
  Filled 2015-04-13: qty 2

## 2015-04-13 MED ORDER — MIDAZOLAM HCL 5 MG/5ML IJ SOLN
INTRAMUSCULAR | Status: AC
  Filled 2015-04-13: qty 5

## 2015-04-13 MED ORDER — FENTANYL CITRATE (PF) 100 MCG/2ML IJ SOLN
INTRAMUSCULAR | Status: DC | PRN
  Administered 2015-04-13: 25 ug via INTRAVENOUS
  Administered 2015-04-13: 50 ug via INTRAVENOUS

## 2015-04-13 MED ORDER — CEFAZOLIN SODIUM-DEXTROSE 2-3 GM-% IV SOLR
INTRAVENOUS | Status: AC
  Filled 2015-04-13: qty 50

## 2015-04-13 MED ORDER — DEXTROSE 5 % IV SOLN
2.0000 g | INTRAVENOUS | Status: DC | PRN
  Administered 2015-04-13: 2 g via INTRAVENOUS

## 2015-04-13 MED ORDER — LIDOCAINE HCL (PF) 1 % IJ SOLN
INTRAMUSCULAR | Status: AC
  Filled 2015-04-13: qty 60

## 2015-04-13 MED ORDER — ALBUTEROL SULFATE (2.5 MG/3ML) 0.083% IN NEBU: 2.5000 mg | INHALATION_SOLUTION | Freq: Four times a day (QID) | RESPIRATORY_TRACT | Status: DC | PRN

## 2015-04-13 MED ORDER — LIDOCAINE HCL (PF) 1 % IJ SOLN
INTRAMUSCULAR | Status: DC | PRN
  Administered 2015-04-13: 60 mL via SUBCUTANEOUS

## 2015-04-13 MED ORDER — SODIUM CHLORIDE 0.9 % IV SOLN: INTRAVENOUS | Status: AC

## 2015-04-13 MED ORDER — ASPIRIN EC 81 MG PO TBEC
81.0000 mg | DELAYED_RELEASE_TABLET | Freq: Every day | ORAL | Status: DC
  Administered 2015-04-13 – 2015-04-14 (×2): 81 mg via ORAL
  Filled 2015-04-13 (×2): qty 1

## 2015-04-13 MED ORDER — SODIUM CHLORIDE 0.9 % IV SOLN
INTRAVENOUS | Status: DC
  Administered 2015-04-13: 12:00:00 via INTRAVENOUS

## 2015-04-13 MED ORDER — FLUMAZENIL 1 MG/10ML IV SOLN
INTRAVENOUS | Status: AC
  Filled 2015-04-13: qty 10

## 2015-04-13 MED ORDER — FUROSEMIDE 40 MG PO TABS
40.0000 mg | ORAL_TABLET | Freq: Two times a day (BID) | ORAL | Status: DC
  Administered 2015-04-14: 40 mg via ORAL
  Filled 2015-04-13 (×2): qty 1

## 2015-04-13 MED ORDER — CEFAZOLIN SODIUM 1-5 GM-% IV SOLN
1.0000 g | Freq: Four times a day (QID) | INTRAVENOUS | Status: AC
Start: 2015-04-13 — End: 2015-04-14
  Administered 2015-04-13 – 2015-04-14 (×3): 1 g via INTRAVENOUS
  Filled 2015-04-13 (×3): qty 50

## 2015-04-13 MED ORDER — FINASTERIDE 5 MG PO TABS
5.0000 mg | ORAL_TABLET | Freq: Every day | ORAL | Status: DC
  Administered 2015-04-13 – 2015-04-14 (×2): 5 mg via ORAL
  Filled 2015-04-13 (×2): qty 1

## 2015-04-13 MED ORDER — FLUMAZENIL 1 MG/10ML IV SOLN
INTRAVENOUS | Status: DC | PRN
  Administered 2015-04-13: 0.3 mg via INTRAVENOUS

## 2015-04-13 SURGICAL SUPPLY — 19 items
BALLN ATTAIN 80 (BALLOONS) ×2
BALLN ATTAIN 80CM 6215 (BALLOONS) ×1
BALLOON ATTAIN 80 (BALLOONS) IMPLANT
CABLE SURGICAL S-101-97-12 (CABLE) ×2 IMPLANT
CATH CPS DIRECT 135 DS2C020 (CATHETERS) ×2 IMPLANT
HEMOSTAT SURGICEL 2X4 FIBR (HEMOSTASIS) IMPLANT
ICD VIVA QUAD XT CRT-D DTBA1QQ (ICD Generator) ×2 IMPLANT
KIT ESSENTIALS PG (KITS) ×2 IMPLANT
LEAD ATTAIN PERFORMA S 4598-88 (Lead) ×2 IMPLANT
LEAD CAPSURE NOVUS 5076-52CM (Lead) ×2 IMPLANT
LEAD SPRINT QUAT SEC 6935M-62 (Lead) ×2 IMPLANT
PAD DEFIB LIFELINK (PAD) ×2 IMPLANT
SHEATH CLASSIC 7F (SHEATH) ×2 IMPLANT
SHEATH CLASSIC 9.5F (SHEATH) ×2 IMPLANT
SHEATH CLASSIC 9F (SHEATH) ×2 IMPLANT
SHIELD RADPAD SCOOP 12X17 (MISCELLANEOUS) ×2 IMPLANT
TRAY PACEMAKER INSERTION (CUSTOM PROCEDURE TRAY) ×2 IMPLANT
WIRE ACUITY WHISPER EDS 4648 (WIRE) ×2 IMPLANT
WIRE HI TORQ VERSACORE-J 145CM (WIRE) ×2 IMPLANT

## 2015-04-13 NOTE — H&P (View-Only) (Signed)
Electrophysiology Office Note   Date:  04/07/2015   ID:  Kenneth [name], DOB 1936-04-21, MRN [id number]  PCP:  [name]., MD  Cardiologist:  RSW Primary Electrophysiologist:   [name], MD    No chief complaint on file.    History of Present Illness: Kenneth [name] is a 79 y.o. male   Seen in followup for consideration of CRT he has hx of ischemic cardiomyopathy with prior bypass and ejection fraction of 35% and aortic valve replacement with Left bundle branch block QRS duration 150 ms And Congestive heart failure-class IIb-IIIa.;  In some time since his last evaluation for coronary artery disease. He underwent Myoview scanning by Dr. Wynonia Lawman which demonstrated no ischemia.  He also has hx of COPD/history of non-small cell lung cancer with left lobectomy   He has problems with shortness of breath and peripheral edema as well as orthopnea.  Today, he denies symptoms of  chest pain, s , bleeding, or neurologic sequela.  he has no  complaints of palpitations, lightheadedness presyncope or syncope  The patient is tolerating medications without difficulties and is otherwise without complaint today.    Past Medical History  Diagnosis Date  . Abdominal aortic aneurysm without mention of rupture   . Kidney stones 05/03/09    HEMATURIA,CYSTOSCOPY DONE  . CAD (coronary artery disease), native coronary artery 03/10/2014    02/28/10 AVR with tissue valve, Cabg with LIMA to LAD, SVG to int, and SVG to RCA Dr. Roxan Hockey  Cath 02/16/2010 Calcified coronaries  LM calcified, LAD, diffuse disease, circ calcified with 60% mid, RCA calcified with 80% prox and 50% mid  36 mm Peak gradient, severe aortic stenosis  2003 Cypher stent in distal RCA , PTCA of diagonal 1996 PTCA of RCA in 2 places with restenosis treated again   . Hypertensive heart disease   . Diabetes mellitus type 2, noninsulin dependent 03/10/2014  . COPD   . Chronic dyspnea   . Chronic diastolic heart failure   .  Lumbar disc disease   . Hyperlipidemia   . History of lung cancer 12/13/2005    LUL lobe Stage 1 non small cell cancer treated with resection Dr. [name]   . GERD 08/06/2008  . Atrial fibrillation   . Cancer 2007    Lung   Past Surgical History  Procedure Laterality Date  . Coronary artery bypass graft  02/2010  . Tissue aortic valve replacement  02/2010  . Tonsillectomy    . Lul resection  12/2005    DR, [name]  Lung Surgery  . Colonoscopy  2008    NORMAL     Current Outpatient Prescriptions  Medication Sig Dispense Refill  . albuterol (PROVENTIL) (2.5 MG/3ML) 0.083% nebulizer solution Take 2.5 mg by nebulization every 6 (six) hours as needed for wheezing or shortness of breath.     Marland Kitchen aspirin 81 MG tablet Take 81 mg by mouth daily.     . cefUROXime (CEFTIN) 500 MG tablet Take 1 tablet by mouth 2 (two) times daily.    . fexofenadine (ALLEGRA) 180 MG tablet Take 180 mg by mouth daily as needed for allergies.     . finasteride (PROSCAR) 5 MG tablet Take 5 mg by mouth daily.     . furosemide (LASIX) 40 MG tablet Take 40 mg by mouth 2 (two) times daily.     . nebivolol (BYSTOLIC) 5 MG tablet Take 5 mg by mouth daily.    Marland Kitchen omeprazole (PRILOSEC) 40 MG capsule Take  40 mg by mouth daily.     . potassium chloride (K-DUR,KLOR-CON) 10 MEQ tablet Take 10 mEq by mouth every other day.     . predniSONE (STERAPRED UNI-PAK 21 TAB) 5 MG (21) TBPK tablet Take as directed    . valsartan-hydrochlorothiazide (DIOVAN-HCT) 320-25 MG per tablet Take 0.5 tablets by mouth daily.     No current facility-administered medications for this visit.    Allergies:   Ciprocin-fluocin-procin; Clarithromycin; and Hydrocodone-acetaminophen   Social History:  The patient  reports that he quit smoking about 9 years ago. His smoking use included Cigarettes. He has a 50 pack-year smoking history. He has never used smokeless tobacco. He reports that he does not drink alcohol or use illicit drugs.   Family History:  The  patient's family history includes Cancer in his mother; Dementia in his mother; Heart attack in his father; Heart disease in his brother and father; Hyperlipidemia in his brother and father; Hypertension in his brother and father.    ROS:  Please see the history of present illness.   Otherwise, review of systems is negative .    PHYSICAL EXAM: VS:  BP 144/86 mmHg  Pulse 68  Ht 5\' 7"  (1.702 m)  Wt 155 lb 3.2 oz (70.398 kg)  BMI 24.30 kg/m2 , BMI Body mass index is 24.3 kg/(m^2). GEN: Well nourished, well developed, in no acute distress HEENT: normal Neck: JVD 6-7, carotid bruits, or masses Cardiac: R RR; no murmur  rubs,  noS4  Respiratory:  clear to auscultation bilaterally, normal work of breathing Back without kyphosis or CVAT GI: soft, nontender, nondistended, + BS MS: no deformity or atrophy Skin: warm and dry,   Extremities No Clubbing cyanosis 1 Edema Neuro:  Strength and sensation are intact Psych: euthymic mood, full affect  EKG:  EKG is ordered today. The ekg ordered today shows sinus rhythm at 68 Intervals 13/15/44 LBBB   Recent Labs: No results found for requested labs within last 365 days.    Lipid Panel  No results found for: CHOL, TRIG, HDL, CHOLHDL, VLDL, LDLCALC, LDLDIRECT   Wt Readings from Last 3 Encounters:  04/07/15 155 lb 3.2 oz (70.398 kg)  03/04/15 151 lb (68.493 kg)  09/07/14 159 lb (72.122 kg)      Other studies Reviewed: Additional studies/ records that were reviewed today include: myoview   Review of the above records today demonstrates: As above    ASSESSMENT AND PLAN: Ischemic cardiomyopathy with prior bypass and ejection fraction of 35% and aortic valve replacement  Left bundle branch block QRS duration 150 ms  Congestive heart failure-class IIb-IIIa  COPD/history of non-small cell lung cancer with left lobectomy  Have reviewed the potential benefits and risks of ICD implantation including but not limited to death, perforation  of heart or lung, lead dislodgement, infection,  device malfunction and inappropriate shocks.  The patient and family *express understanding  and are willing to proceed.     Current medicines are reviewed at length with the patient today.   The patient does not have concerns regarding his medicines.  The following changes were made today:  none  Labs/ tests ordered today include:    No orders of the defined types were placed in this encounter.     Disposition:  proeed with CRT implantation    Signed, [name], MD  04/07/2015 8:23 AM     [address] [place] [address] [phone] (office) [phone] (fax)

## 2015-04-13 NOTE — Progress Notes (Signed)
Pt states he is having some pain, MD paged for pain medication. Pt states he will start with Tylenol 650mg , will continue to monitor. Etta Quill, RN

## 2015-04-13 NOTE — Interval H&P Note (Signed)
ICD Criteria  Current LVEF:35% ;Obtained > or = 1 month ago and < or = 3 months ago.  NYHA Functional Classification: Class III  Heart Failure History:  Yes, Duration of heart failure since onset is > 9 months  Non-Ischemic Dilated Cardiomyopathy History:  Yes, timeframe is > 9 months  Atrial Fibrillation/Atrial Flutter:  No.  Ventricular Tachycardia History:  No.  Cardiac Arrest History:  No  History of Syndromes with Risk of Sudden Death:  No.  Previous ICD:  No.  Electrophysiology Study: No.  Prior MI: No.  PPM: No.  OSA:  No  Patient Life Expectancy of >=1 year: Yes.  Anticoagulation Therapy:  Patient is NOT on anticoagulation therapy.   Beta Blocker Therapy:  Yes.   Ace Inhibitor/ARB Therapy:  Yes.History and Physical Interval Note:  04/13/2015 4:24 PM  Kenneth [name]  has presented today for surgery, with the diagnosis of chf  The various methods of treatment have been discussed with the patient and family. After consideration of risks, benefits and other options for treatment, the patient has consented to  Procedure(s): BiV ICD Insertion CRT-D (N/A) as a surgical intervention .  The patient's history has been reviewed, patient examined, no change in status, stable for surgery.  I have reviewed the patient's chart and labs.  Questions were answered to the patient's satisfaction.     [name]

## 2015-04-13 NOTE — Progress Notes (Signed)
Pt remains alert and oriented.Family at bedside engaged in conversation. Will Tx to 3W14  90 min post romazicon.

## 2015-04-14 ENCOUNTER — Ambulatory Visit (HOSPITAL_COMMUNITY): Payer: Medicare HMO

## 2015-04-14 ENCOUNTER — Encounter (HOSPITAL_COMMUNITY): Payer: Self-pay | Admitting: Internal Medicine

## 2015-04-14 DIAGNOSIS — I447 Left bundle-branch block, unspecified: Secondary | ICD-10-CM | POA: Diagnosis not present

## 2015-04-14 DIAGNOSIS — R000 Tachycardia, unspecified: Secondary | ICD-10-CM | POA: Diagnosis present

## 2015-04-14 DIAGNOSIS — I429 Cardiomyopathy, unspecified: Secondary | ICD-10-CM

## 2015-04-14 DIAGNOSIS — E785 Hyperlipidemia, unspecified: Secondary | ICD-10-CM | POA: Diagnosis not present

## 2015-04-14 DIAGNOSIS — I255 Ischemic cardiomyopathy: Secondary | ICD-10-CM | POA: Diagnosis not present

## 2015-04-14 DIAGNOSIS — E119 Type 2 diabetes mellitus without complications: Secondary | ICD-10-CM | POA: Diagnosis not present

## 2015-04-14 MED FILL — Heparin Sodium (Porcine) 2 Unit/ML in Sodium Chloride 0.9%: INTRAMUSCULAR | Qty: 500 | Status: AC

## 2015-04-14 NOTE — Op Note (Signed)
Kenneth [name], Kenneth [name] NO.:  [id number]  MEDICAL RECORD NO.:  [id number]  LOCATION:  [id number]                        FACILITY:  [facility]  PHYSICIAN:  [name], MD, FACCDATE OF BIRTH:  [birth year]  DATE OF PROCEDURE: DATE OF DISCHARGE:                              OPERATIVE REPORT   PREOPERATIVE DIAGNOSIS:  Nonischemic/valvular cardiomyopathy, left bundle-branch block, congestive heart failure.  POSTOPERATIVE DIAGNOSIS:  Nonischemic/valvular cardiomyopathy, left bundle-branch block, congestive heart failure.  PROCEDURE:  Dual-chamber defibrillator implantation with left ventricular lead placement.  DESCRIPTION OF PROCEDURE:  Following obtaining informed consent, the patient was brought to electrophysiology laboratory and placed on the fluoroscopic table in the supine position.  After routine prep and drape of the left upper chest, lidocaine was infiltrated in the prepectoral area, and incision was made and carried down to layer of the prepectoral fascia.  Using electrocautery and sharp dissection, a pocket was formed similarly.  Hemostasis was obtained.  Thereafter, attention was turned to gain access to the extrathoracic left subclavian vein, which was accomplished without difficulty, but with puncture, the artery x1, pressure was held for 2 minutes. Subsequently, 3 separate venipunctures were accomplished.  Guidewires were placed and retained and sequentially subsequently a 9-French, 9.5, and 7-French sheaths were placed, through which we passed a Medtronic 6935 62 cm quadripolar DF4ICD lead, serial number [id number], a St. Jude 135CS cannulation sheath, and a Medtronic 5076 52 cm active fixation atrial lead, serial number [id number].  Under fluoroscopic guidance, the RV lead was manipulated of the apex where the bipolar R-wave was 18 with a pace impedance of 596, a threshold 0.6 V at 0.5 millisecond.  Current threshold 1.0 mA, and there is no  diaphragmatic pacing at 10 V, and the current of injury was brisk. This lead was secured to the prepectoral fascia.  We then obtained access to the coronary sinus without difficulty.  A venogram demonstrated a high posterolateral branch, which we were then able to cannulate.  There was a larger more lateral ramification, which we then targeted, and we were able to deploy the lead at the junction between the mid and distal third where the tip of the lead actually began to coarse basilar again.  In this location, there is diaphragmatic stimulation to pole 1.  We then went through a 2-3 configuration with a pace impedance was 626.  The threshold was less than 1 V, and there is no diaphragmatic pacing at 10 V.  The 9.5-French sheath was removed.  We then deployed the right atrial lead in the appendage remnant where the bipolar P-wave was 3.9 with a pace impedance of 631, a threshold 1 V at 0.5.  Current threshold is 1.4 mA, and there is no diaphragmatic pacing at 10 V.  The current intervention was brisk.  This lead was secured to the prepectoral fascia.  We then removed the CS deployment sheath was secured the CS lead to the prepectoral fascia.  The pocket was copiously irrigated with antibiotic containing saline solution, and the leads were then attached to Medtronic Viva pulse generator, serial number [id number].  Through the device, the bipolar P-wave was 1.8 with a pace impedance of 399, a threshold of 0.5 and  0.4.  The R-wave was greater than 12 with a pace impedance of 456, a threshold 0.5 at 0.4, and the LV impedance in the 2- 3 configuration was 0.5 V with impedance of 570 and threshold 0.5 at 0.4.  The QLV was 110 milliseconds.  The leads and the pulse generator were placed in the pocket, secured to the prepectoral fascia.  The pocket having been copiously irrigated. The wound was closed in 2 layers in normal fashion.  The wound was washed, dried, and a benzoin Steri-Strip  dressing was applied.  Needle counts, sponge counts, and instrument counts were correct at the end of the procedure according to the staff.  The patient tolerated the procedure without significant complication.  The patient did become quite hypopneic with sedation and actually received 0.3 mg of Romazicon to stimulate his breathing.  There were no untoward affects.  Estimated blood loss was minimal.     [name], MD, [facility]     SCK/MEDQ  D:  04/13/2015  T:  04/14/2015  Job:  [id number]

## 2015-04-14 NOTE — Discharge Summary (Signed)
Physician Discharge Summary     Cardiologist:  Dr. Wynonia Lawman EP:  Dr Caryl Comes  Patient ID: Kenneth [name] MRN: [id number] DOB/AGE: April 15, 1936 79 y.o.  Admit date: 04/13/2015 Discharge date: 04/14/2015  Admission Diagnoses:  Dual-chamber defibrillator implantation with left ventricular lead placement  Discharge Diagnoses:  Active Problems:   S/P AVR (aortic valve replacement)   Chronic systolic heart failure   LBBB (left bundle branch block)   NICM (nonischemic cardiomyopathy)   Sinus bradycardia   Biventricular automatic implantable cardioverter defibrillator in situ   Discharged Condition: stable  Hospital Course:  Kenneth [name] is a 79 y.o. male Seen in followup for consideration of CRT he has hx of ischemic cardiomyopathy with prior bypass and ejection fraction of 35% and aortic valve replacement with Left bundle branch block QRS duration 150 ms And Congestive heart failure-class IIb-IIIa.; In some time since his last evaluation for coronary artery disease. He underwent Myoview scanning by Dr. Wynonia Lawman which demonstrated no ischemia.  He also has hx of COPD/history of non-small cell lung cancer with left lobectomy. He has problems with shortness of breath and peripheral edema as well as orthopnea. He was seen in the office on 5/26 and denied symptoms of chest pain, bleeding, or neurologic sequela. He had no complaints of palpitations, lightheadedness presyncope or syncope. The patient is tolerating medications without difficulties and is otherwise without complaint today.   He was admitted for ICD implant which was completed without complications. No pneumothorax on CXR.  The patient was seen by Dr. Caryl Comes who felt he was stable for DC home.   Device:  Medtronic 6935 62 cm quadripolar DF4ICD lead, serial number [id number], a St. Jude 135CS cannulation sheath, and a Medtronic 5076 52 cm active fixation atrial lead, serial number [id number].  Consults: None  Significant  Diagnostic Studies:  OPERATIVE REPORT   PREOPERATIVE DIAGNOSIS: Nonischemic/valvular cardiomyopathy, left bundle-branch block, congestive heart failure.  POSTOPERATIVE DIAGNOSIS: Nonischemic/valvular cardiomyopathy, left bundle-branch block, congestive heart failure.  PROCEDURE: Dual-chamber defibrillator implantation with left ventricular lead placement.  DESCRIPTION OF PROCEDURE: Following obtaining informed consent, the patient was brought to electrophysiology laboratory and placed on the fluoroscopic table in the supine position. After routine prep and drape of the left upper chest, lidocaine was infiltrated in the prepectoral area, and incision was made and carried down to layer of the prepectoral fascia. Using electrocautery and sharp dissection, a pocket was formed similarly. Hemostasis was obtained.  Thereafter, attention was turned to gain access to the extrathoracic left subclavian vein, which was accomplished without difficulty, but with puncture, the artery x1, pressure was held for 2 minutes. Subsequently, 3 separate venipunctures were accomplished. Guidewires were placed and retained and sequentially subsequently a 9-French, 9.5, and 7-French sheaths were placed, through which we passed a Medtronic 6935 62 cm quadripolar DF4ICD lead, serial number [id number], a St. Jude 135CS cannulation sheath, and a Medtronic 5076 52 cm active fixation atrial lead, serial number [id number].  Under fluoroscopic guidance, the RV lead was manipulated of the apex where the bipolar R-wave was 18 with a pace impedance of 596, a threshold 0.6 V at 0.5 millisecond. Current threshold 1.0 mA, and there is no diaphragmatic pacing at 10 V, and the current of injury was brisk. This lead was secured to the prepectoral fascia.  We then obtained access to the coronary sinus without difficulty. A venogram demonstrated a high posterolateral branch, which we were then able to  cannulate. There was a larger more lateral ramification, which we then targeted, and  we were able to deploy the lead at the junction between the mid and distal third where the tip of the lead actually began to coarse basilar again. In this location, there is diaphragmatic stimulation to pole 1. We then went through a 2-3 configuration with a pace impedance was 626. The threshold was less than 1 V, and there is no diaphragmatic pacing at 10 V. The 9.5-French sheath was removed.  We then deployed the right atrial lead in the appendage remnant where the bipolar P-wave was 3.9 with a pace impedance of 631, a threshold 1 V at 0.5. Current threshold is 1.4 mA, and there is no diaphragmatic pacing at 10 V. The current intervention was brisk. This lead was secured to the prepectoral fascia.  We then removed the CS deployment sheath was secured the CS lead to the prepectoral fascia. The pocket was copiously irrigated with antibiotic containing saline solution, and the leads were then attached to Medtronic Viva pulse generator, serial number [id number]. Through the device, the bipolar P-wave was 1.8 with a pace impedance of 399, a threshold of 0.5 and 0.4. The R-wave was greater than 12 with a pace impedance of 456, a threshold 0.5 at 0.4, and the LV impedance in the 2- 3 configuration was 0.5 V with impedance of 570 and threshold 0.5 at 0.4. The QLV was 110 milliseconds.  The leads and the pulse generator were placed in the pocket, secured to the prepectoral fascia. The pocket having been copiously irrigated. The wound was closed in 2 layers in normal fashion. The wound was washed, dried, and a benzoin Steri-Strip dressing was applied. Needle counts, sponge counts, and instrument counts were correct at the end of the procedure according to the staff. The patient tolerated the procedure without significant complication. The patient did become quite hypopneic with sedation and  actually received 0.3 mg of Romazicon to stimulate his breathing. There were no untoward affects. Estimated blood loss was minimal.   [name], MD, [facility]  Treatments: See above  Discharge Exam: Blood pressure 96/69, pulse 71, temperature 98.5 F (36.9 C), temperature source Oral, resp. rate 17, height 5\' 7"  (1.702 m), weight 150 lb 3.2 oz (68.13 kg), SpO2 94 %.   Disposition:       Discharge Instructions    Call MD for:  redness, tenderness, or signs of infection (pain, swelling, redness, odor or green/yellow discharge around incision site)    Complete by:  As directed      Diet - low sodium heart healthy    Complete by:  As directed      Increase activity slowly    Complete by:  As directed             Medication List    STOP taking these medications        predniSONE 5 MG (21) Tbpk tablet  Commonly known as:  STERAPRED UNI-PAK 21 TAB      TAKE these medications        albuterol (2.5 MG/3ML) 0.083% nebulizer solution  Commonly known as:  PROVENTIL  Take 2.5 mg by nebulization every 6 (six) hours as needed for wheezing or shortness of breath.     aspirin 81 MG tablet  Take 81 mg by mouth daily.     cefUROXime 500 MG tablet  Commonly known as:  CEFTIN  Take 1 tablet by mouth 2 (two) times daily.     fexofenadine 180 MG tablet  Commonly known as:  ALLEGRA  Take  180 mg by mouth daily as needed for allergies.     finasteride 5 MG tablet  Commonly known as:  PROSCAR  Take 5 mg by mouth daily.     furosemide 40 MG tablet  Commonly known as:  LASIX  Take 40 mg by mouth 2 (two) times daily.     nebivolol 5 MG tablet  Commonly known as:  BYSTOLIC  Take 5 mg by mouth daily.     omeprazole 40 MG capsule  Commonly known as:  PRILOSEC  Take 40 mg by mouth daily.     potassium chloride 10 MEQ tablet  Commonly known as:  K-DUR,KLOR-CON  Take 10 mEq by mouth every other day.     valsartan-hydrochlorothiazide 320-25 MG per tablet  Commonly known  as:  DIOVAN-HCT  Take 0.5 tablets by mouth daily.       Follow-up Information    Follow up with [name], MD On 07/22/2015.   Specialty:  Cardiology   Why:  3:00 PM   Contact information:   [address] [address] [phone]       Follow up with Wound check On 04/25/2015.   Why:  9:00 AM   Contact information:   [address] [address] [phone]      Follow up with Kerry Hough, MD.   Specialty:  Cardiology   Why:  The office will call you with the follow up appt. date and time.   Contact information:   [address] [place] [address] [phone]      Greater than 30 minutes was spent completing the patient's discharge.    SignedTarri Fuller, [place] 04/14/2015, 12:10 PM

## 2015-04-14 NOTE — Discharge Instructions (Signed)
° ° °  Supplemental Discharge Instructions for  Pacemaker/Defibrillator Patients  Activity No heavy lifting or vigorous activity with your left/right arm for 6 to 8 weeks.  Do not raise your left/right arm above your head for one week.  Gradually raise your affected arm as drawn below.           __  NO DRIVING for One week    ; you may begin driving on  June 8   .  WOUND CARE - Keep the wound area clean and dry.   - May shower tomorrow.  No direct spray from the shower. - The tape/steri-strips on your wound will fall off; do not pull them off.  No bandage is needed on the site.  DO  NOT apply any creams, oils, or ointments to the wound area. - If you notice any drainage or discharge from the wound, any swelling or bruising at the site, or you develop a fever > 101? F after you are discharged home, call the office at once.  Special Instructions - You are still able to use cellular telephones; use the ear opposite the side where you have your pacemaker/defibrillator.  Avoid carrying your cellular phone near your device. - When traveling through airports, show security personnel your identification card to avoid being screened in the metal detectors.  Ask the security personnel to use the hand wand. - Avoid arc welding equipment, MRI testing (magnetic resonance imaging), TENS units (transcutaneous nerve stimulators).  Call the office for questions about other devices. - Avoid electrical appliances that are in poor condition or are not properly grounded. - Microwave ovens are safe to be near or to operate.  Additional information for defibrillator patients should your device go off: - If your device goes off ONCE and you feel fine afterward, notify the device clinic nurses. - If your device goes off ONCE and you do not feel well afterward, call 911. - If your device goes off TWICE, call 911. - If your device goes off THREE times in one day, call 911.  DO NOT DRIVE YOURSELF OR A FAMILY  MEMBER WITH A DEFIBRILLATOR TO THE HOSPITAL--CALL 911.

## 2015-04-14 NOTE — Progress Notes (Signed)
Patient Name: Kenneth [name]      SUBJECTIVE:without chest pain   Breathing somewhat better  Past Medical History  Diagnosis Date  . Abdominal aortic aneurysm without mention of rupture   . Kidney stones 05/03/09    HEMATURIA,CYSTOSCOPY DONE  . CAD (coronary artery disease), native coronary artery 03/10/2014    02/28/10 AVR with tissue valve, Cabg with LIMA to LAD, SVG to int, and SVG to RCA Dr. Roxan Hockey  Cath 02/16/2010 Calcified coronaries  LM calcified, LAD, diffuse disease, circ calcified with 60% mid, RCA calcified with 80% prox and 50% mid  36 mm Peak gradient, severe aortic stenosis  2003 Cypher stent in distal RCA , PTCA of diagonal 1996 PTCA of RCA in 2 places with restenosis treated again   . Hypertensive heart disease   . Diabetes mellitus type 2, noninsulin dependent 03/10/2014  . COPD   . Chronic dyspnea   . Chronic diastolic heart failure   . Lumbar disc disease   . Hyperlipidemia   . History of lung cancer 12/13/2005    LUL lobe Stage 1 non small cell cancer treated with resection Dr. [name]   . GERD 08/06/2008  . Atrial fibrillation   . Cancer 2007    Lung    Scheduled Meds:  Scheduled Meds: . aspirin EC  81 mg Oral Daily  .  ceFAZolin (ANCEF) IV  1 g Intravenous Q6H  . cefUROXime  500 mg Oral BID  . finasteride  5 mg Oral Daily  . furosemide  40 mg Oral BID  . hydrochlorothiazide  12.5 mg Oral Daily  . irbesartan  150 mg Oral Daily  . nebivolol  5 mg Oral Daily  . pantoprazole  40 mg Oral Daily  . potassium chloride  10 mEq Oral QODAY   Continuous Infusions:  acetaminophen, albuterol, ondansetron (ZOFRAN) IV    PHYSICAL EXAM Filed Vitals:   04/13/15 1951 04/13/15 2100 04/14/15 0021 04/14/15 0630  BP: 121/75 114/71 119/78 141/92  Pulse: 82 74 77 70  Temp:  97.8 F (36.6 C) 97.9 F (36.6 C) 97.7 F (36.5 C)  TempSrc:  Oral Oral Oral  Resp: 15 16 11 16   Height:      Weight:    68.13 kg (150 lb 3.2 oz)  SpO2: 92% 93% 94% 95%    Well  developed and nourished in no acute distress HENT normal Neck supple with JVP-flat Clear withiout hematoma Regular rate and rhythm, no murmurs or gallops Abd-soft with active BS No Clubbing cyanosis edema Skin-warm and dry A & Oriented  Grossly normal sensory and motor function  TELEMETRY: Reviewed telemetry pt in *P-synchronous/ AV  pacing    Intake/Output Summary (Last 24 hours) at 04/14/15 0723 Last data filed at 04/14/15 0647  Gross per 24 hour  Intake    240 ml  Output    550 ml  Net   -310 ml    LABS: Basic Metabolic Panel:  Recent Labs Lab 04/07/15 0920  NA 134*  K 4.2  CL 99  CO2 27  GLUCOSE 152*  BUN 22  CREATININE 1.07  CALCIUM 9.8   Cardiac Enzymes: No results for input(s): CKTOTAL, CKMB, CKMBINDEX, TROPONINI in the last 72 hours. CBC:  Recent Labs Lab 04/07/15 0920  WBC 11.4*  NEUTROABS 10.0*  HGB 15.0  HCT 44.2  MCV 93.6  PLT 224.0   Post implant CXR was reviewed and demonstrates stable lead position without  evidence of pneumothorax  Device Interrogation: normal divice funtion  ASSESSMENT AND PLAN:  Active Problems:   S/P AVR (aortic valve replacement)   Chronic systolic heart failure   LBBB (left bundle branch block)   NICM (nonischemic cardiomyopathy)   Sinus bradycardia  Home today BMET and wound check in 10-14 days F/U WST per his schedule  SK in 8months   Instructions given May shower tomorrow  Signed, [name] MD  04/14/2015 '

## 2015-04-25 ENCOUNTER — Ambulatory Visit (INDEPENDENT_AMBULATORY_CARE_PROVIDER_SITE_OTHER): Payer: Medicare HMO | Admitting: *Deleted

## 2015-04-25 DIAGNOSIS — I429 Cardiomyopathy, unspecified: Secondary | ICD-10-CM | POA: Diagnosis not present

## 2015-04-25 DIAGNOSIS — Z95810 Presence of automatic (implantable) cardiac defibrillator: Secondary | ICD-10-CM | POA: Diagnosis not present

## 2015-04-25 DIAGNOSIS — I5022 Chronic systolic (congestive) heart failure: Secondary | ICD-10-CM | POA: Diagnosis not present

## 2015-04-25 DIAGNOSIS — I447 Left bundle-branch block, unspecified: Secondary | ICD-10-CM | POA: Diagnosis not present

## 2015-04-25 DIAGNOSIS — I428 Other cardiomyopathies: Secondary | ICD-10-CM

## 2015-04-25 DIAGNOSIS — R001 Bradycardia, unspecified: Secondary | ICD-10-CM

## 2015-04-25 LAB — CUP PACEART INCLINIC DEVICE CHECK
Battery Voltage: 3.11 V
Brady Statistic AP VP Percent: 1.61 %
Brady Statistic AP VS Percent: 0.13 %
Brady Statistic RA Percent Paced: 1.73 %
Brady Statistic RV Percent Paced: 98.73 %
Date Time Interrogation Session: 20160613113337
HIGH POWER IMPEDANCE MEASURED VALUE: 64 Ohm
HighPow Impedance: 190 Ohm
Lead Channel Impedance Value: 399 Ohm
Lead Channel Impedance Value: 399 Ohm
Lead Channel Pacing Threshold Amplitude: 0.625 V
Lead Channel Pacing Threshold Amplitude: 0.625 V
Lead Channel Pacing Threshold Pulse Width: 0.4 ms
Lead Channel Pacing Threshold Pulse Width: 0.4 ms
Lead Channel Sensing Intrinsic Amplitude: 17.5 mV
Lead Channel Sensing Intrinsic Amplitude: 23.375 mV
Lead Channel Setting Pacing Amplitude: 2.5 V
Lead Channel Setting Pacing Amplitude: 3.5 V
Lead Channel Setting Pacing Pulse Width: 0.4 ms
Lead Channel Setting Pacing Pulse Width: 0.4 ms
MDC IDC MSMT BATTERY REMAINING LONGEVITY: 91 mo
MDC IDC MSMT LEADCHNL LV PACING THRESHOLD AMPLITUDE: 0.625 V
MDC IDC MSMT LEADCHNL RA PACING THRESHOLD PULSEWIDTH: 0.4 ms
MDC IDC MSMT LEADCHNL RA SENSING INTR AMPL: 1.875 mV
MDC IDC MSMT LEADCHNL RA SENSING INTR AMPL: 2.5 mV
MDC IDC SET LEADCHNL RV PACING AMPLITUDE: 3.5 V
MDC IDC SET LEADCHNL RV SENSING SENSITIVITY: 0.3 mV
MDC IDC STAT BRADY AS VP PERCENT: 97.67 %
MDC IDC STAT BRADY AS VS PERCENT: 0.59 %
Zone Setting Detection Interval: 250 ms
Zone Setting Detection Interval: 300 ms
Zone Setting Detection Interval: 350 ms
Zone Setting Detection Interval: 450 ms

## 2015-04-25 NOTE — Progress Notes (Signed)
Wound check appointment. No steri-strips, dermabond used. Wound without redness or edema. Incision edges approximated, wound well healed. Normal device function. Thresholds, sensing, and impedances consistent with implant measurements. Device programmed at 3.5V for extra safety margin until 3 month visit. Histogram distribution appropriate for patient and level of activity. No mode switches or ventricular arrhythmias noted. Patient educated about wound care, arm mobility, lifting restrictions, shock plan. ROV w/ SK 07/21/15.

## 2015-04-28 ENCOUNTER — Telehealth: Payer: Self-pay | Admitting: Internal Medicine

## 2015-05-03 ENCOUNTER — Other Ambulatory Visit (HOSPITAL_COMMUNITY): Payer: Medicare HMO

## 2015-05-03 ENCOUNTER — Ambulatory Visit: Payer: Medicare HMO | Admitting: Vascular Surgery

## 2015-05-06 ENCOUNTER — Telehealth: Payer: Self-pay | Admitting: Internal Medicine

## 2015-05-06 NOTE — Telephone Encounter (Signed)
New Message     2. Is you device beeping? Yes     4. Are you calling to see if we received your device transmission? Yes  Pt states that they went out of town and experienced a sound with the device. He states that it was the sound like that of a tire going flat. Sent call the the device clinic

## 2015-05-06 NOTE — Telephone Encounter (Signed)
Spoke to patient earlier about sending a remote transmission.   Patient called back to see if the transmission was received. I checked the [facility] website and discovered that the pt's [facility] transmissions were actually being sent to Dr.Tilley's office. I informed the patient of this and encouraged him to call their ofc for the results.  I called patient back an hour later to see if he had found out any information from Dr.Tilley's office. Patient said they closed at 3pm.   I told him that the sound that he heard was probably unrelated to his device. I asked patient about his device site. Patient denied redness, edema, or heat in area. I encouraged him to call Dr.Tilley's ofc on Monday to ensure that the device is still functioning properly. Patient voiced understanding.   I encouraged him to go to ER if he has any problems before Monday. Again, patient voiced understanding.

## 2015-05-06 NOTE — Telephone Encounter (Signed)
error 

## 2015-05-09 ENCOUNTER — Encounter: Payer: Self-pay | Admitting: Internal Medicine

## 2015-05-20 ENCOUNTER — Encounter: Payer: Self-pay | Admitting: Vascular Surgery

## 2015-05-24 ENCOUNTER — Ambulatory Visit (HOSPITAL_COMMUNITY)
Admission: RE | Admit: 2015-05-24 | Discharge: 2015-05-24 | Disposition: A | Discharge status: Home / Self Care | Payer: Medicare HMO | Source: Ambulatory Visit | Attending: Vascular Surgery | Admitting: Vascular Surgery

## 2015-05-24 ENCOUNTER — Other Ambulatory Visit: Payer: Self-pay | Admitting: Vascular Surgery

## 2015-05-24 ENCOUNTER — Ambulatory Visit (INDEPENDENT_AMBULATORY_CARE_PROVIDER_SITE_OTHER): Payer: Medicare HMO | Admitting: Vascular Surgery

## 2015-05-24 ENCOUNTER — Encounter: Payer: Self-pay | Admitting: Vascular Surgery

## 2015-05-24 VITALS — BP 124/87 | HR 74 | Ht 67.0 in | Wt 155.0 lb

## 2015-05-24 DIAGNOSIS — I7409 Other arterial embolism and thrombosis of abdominal aorta: Secondary | ICD-10-CM | POA: Diagnosis not present

## 2015-05-24 DIAGNOSIS — I714 Abdominal aortic aneurysm, without rupture, unspecified: Secondary | ICD-10-CM

## 2015-05-24 DIAGNOSIS — I708 Atherosclerosis of other arteries: Secondary | ICD-10-CM | POA: Insufficient documentation

## 2015-05-24 NOTE — Addendum Note (Signed)
Addended by: Dorthula Rue L on: 05/24/2015 02:27 PM   Modules accepted: Orders

## 2015-05-24 NOTE — Progress Notes (Signed)
Patient resents today for continued follow-up of his asymptomatic abdominal aortic aneurysm. This is been followed for a number of years with slow growth over this time. My last visit with him 6 months ago he has had cardiac issues with implantation of a defibrillator pacemaker. Also has had recent cataract and retinal detachment surgery. Otherwise his had no new difficulty. Did have some shortness of breath prior to his defibrillator placement with probable congestive failure symptoms.  Past Medical History  Diagnosis Date  . Abdominal aortic aneurysm without mention of rupture   . Kidney stones 05/03/09    HEMATURIA,CYSTOSCOPY DONE  . CAD (coronary artery disease), native coronary artery 03/10/2014    02/28/10 AVR with tissue valve, Cabg with LIMA to LAD, SVG to int, and SVG to RCA Dr. Roxan Hockey  Cath 02/16/2010 Calcified coronaries  LM calcified, LAD, diffuse disease, circ calcified with 60% mid, RCA calcified with 80% prox and 50% mid  36 mm Peak gradient, severe aortic stenosis  2003 Cypher stent in distal RCA , PTCA of diagonal 1996 PTCA of RCA in 2 places with restenosis treated again   . Hypertensive heart disease   . Diabetes mellitus type 2, noninsulin dependent 03/10/2014  . COPD   . Chronic dyspnea   . Chronic diastolic heart failure   . Lumbar disc disease   . Hyperlipidemia   . History of lung cancer 12/13/2005    LUL lobe Stage 1 non small cell cancer treated with resection Dr. [name]   . GERD 08/06/2008  . Atrial fibrillation   . Cancer 2007    Lung  . Hypertension   . LBBB (left bundle branch block)   . AICD (automatic cardioverter/defibrillator) present 04/13/2015    icd   . CHF (congestive heart failure)     History  Substance Use Topics  . Smoking status: Former Smoker -- 1.00 packs/day for 50 years    Types: Cigarettes    Quit date: 12/13/2005  . Smokeless tobacco: Never Used     Comment: remotely quit smoking  . Alcohol Use: No    Family History  Problem Relation  Age of Onset  . Heart disease Father     MI/CABG  . Hyperlipidemia Father   . Hypertension Father   . Heart attack Father     Several attacks  . Diabetes Father   . Cancer Mother     Breast  . Dementia Mother   . Heart disease Brother     Before age 75  . Hyperlipidemia Brother   . Hypertension Brother   . Heart attack Brother   . Peripheral vascular disease Brother   . Diabetes Daughter     Allergies  Allergen Reactions  . Ciprocin-Fluocin-Procin [Fluocinolone] Nausea Only  . Clarithromycin Nausea Only    Altered taste  . Hydrocodone-Acetaminophen Nausea Only     Current outpatient prescriptions:  .  albuterol (PROVENTIL) (2.5 MG/3ML) 0.083% nebulizer solution, Take 2.5 mg by nebulization every 6 (six) hours as needed for wheezing or shortness of breath. , Disp: , Rfl:  .  aspirin 81 MG tablet, Take 81 mg by mouth daily. , Disp: , Rfl:  .  finasteride (PROSCAR) 5 MG tablet, Take 5 mg by mouth daily. , Disp: , Rfl:  .  furosemide (LASIX) 40 MG tablet, Take 40 mg by mouth 2 (two) times daily. , Disp: , Rfl:  .  nebivolol (BYSTOLIC) 5 MG tablet, Take 5 mg by mouth daily., Disp: , Rfl:  .  omeprazole (PRILOSEC)  40 MG capsule, Take 40 mg by mouth daily. , Disp: , Rfl:  .  potassium chloride (K-DUR,KLOR-CON) 10 MEQ tablet, Take 10 mEq by mouth every other day. , Disp: , Rfl:  .  valsartan-hydrochlorothiazide (DIOVAN-HCT) 320-25 MG per tablet, Take 0.5 tablets by mouth daily., Disp: , Rfl:  .  fexofenadine (ALLEGRA) 180 MG tablet, Take 180 mg by mouth daily as needed for allergies. , Disp: , Rfl:   Filed Vitals:   05/24/15 0955  BP: 124/87  Pulse: 74  Height: 5\' 7"  (1.702 m)  Weight: 155 lb (70.308 kg)  SpO2: 96%    Body mass index is 24.27 kg/(m^2).       Physical exam his carotid arteries without bruits bilaterally is easily palpable radial femoral and dorsalis pedis pulses. Abdomen is soft with no tenderness and no masses noted and no aneurysm palpable  Duplex  ultrasound today reveals no change from a 76 months ago. Maximal diameter is 5. which was the same reading as October 2015  Impression and plan stable 5.1 cm aneurysm was no growth. Again discussed symptoms of leaking aneurysm with the patient is notify should this occur. Otherwise again in exposed with repeat duplex. Explained that his annual risk for rupture would be significantly less than 5% and therefore would recommend continued observation. CT scan one year ago showed that he would be a stent graft candidate from his current anatomy.

## 2015-07-21 ENCOUNTER — Encounter: Payer: Medicare HMO | Admitting: Internal Medicine

## 2015-08-02 ENCOUNTER — Encounter (HOSPITAL_COMMUNITY): Payer: Self-pay | Admitting: Internal Medicine

## 2015-08-02 ENCOUNTER — Inpatient Hospital Stay (HOSPITAL_COMMUNITY)
Admission: AD | Admit: 2015-08-02 | Discharge: 2015-08-11 | DRG: 987 | Disposition: A | Discharge status: Home / Self Care | Payer: Medicare HMO | Source: Other Acute Inpatient Hospital | Attending: Family Medicine | Admitting: Family Medicine

## 2015-08-02 DIAGNOSIS — E785 Hyperlipidemia, unspecified: Secondary | ICD-10-CM | POA: Diagnosis present

## 2015-08-02 DIAGNOSIS — J189 Pneumonia, unspecified organism: Secondary | ICD-10-CM | POA: Diagnosis not present

## 2015-08-02 DIAGNOSIS — Z952 Presence of prosthetic heart valve: Secondary | ICD-10-CM

## 2015-08-02 DIAGNOSIS — I110 Hypertensive heart disease with heart failure: Secondary | ICD-10-CM | POA: Diagnosis not present

## 2015-08-02 DIAGNOSIS — Z809 Family history of malignant neoplasm, unspecified: Secondary | ICD-10-CM

## 2015-08-02 DIAGNOSIS — J849 Interstitial pulmonary disease, unspecified: Secondary | ICD-10-CM | POA: Diagnosis not present

## 2015-08-02 DIAGNOSIS — Z951 Presence of aortocoronary bypass graft: Secondary | ICD-10-CM | POA: Diagnosis not present

## 2015-08-02 DIAGNOSIS — E119 Type 2 diabetes mellitus without complications: Secondary | ICD-10-CM | POA: Diagnosis not present

## 2015-08-02 DIAGNOSIS — I714 Abdominal aortic aneurysm, without rupture, unspecified: Secondary | ICD-10-CM | POA: Diagnosis present

## 2015-08-02 DIAGNOSIS — Z9981 Dependence on supplemental oxygen: Secondary | ICD-10-CM

## 2015-08-02 DIAGNOSIS — K219 Gastro-esophageal reflux disease without esophagitis: Secondary | ICD-10-CM | POA: Diagnosis present

## 2015-08-02 DIAGNOSIS — E0965 Drug or chemical induced diabetes mellitus with hyperglycemia: Secondary | ICD-10-CM | POA: Diagnosis present

## 2015-08-02 DIAGNOSIS — Z85118 Personal history of other malignant neoplasm of bronchus and lung: Secondary | ICD-10-CM

## 2015-08-02 DIAGNOSIS — Y95 Nosocomial condition: Secondary | ICD-10-CM | POA: Diagnosis not present

## 2015-08-02 DIAGNOSIS — R05 Cough: Secondary | ICD-10-CM

## 2015-08-02 DIAGNOSIS — I2510 Atherosclerotic heart disease of native coronary artery without angina pectoris: Secondary | ICD-10-CM | POA: Diagnosis not present

## 2015-08-02 DIAGNOSIS — I350 Nonrheumatic aortic (valve) stenosis: Secondary | ICD-10-CM | POA: Diagnosis present

## 2015-08-02 DIAGNOSIS — T380X5A Adverse effect of glucocorticoids and synthetic analogues, initial encounter: Secondary | ICD-10-CM | POA: Diagnosis present

## 2015-08-02 DIAGNOSIS — J439 Emphysema, unspecified: Secondary | ICD-10-CM | POA: Diagnosis not present

## 2015-08-02 DIAGNOSIS — R918 Other nonspecific abnormal finding of lung field: Secondary | ICD-10-CM | POA: Diagnosis not present

## 2015-08-02 DIAGNOSIS — I255 Ischemic cardiomyopathy: Secondary | ICD-10-CM | POA: Diagnosis present

## 2015-08-02 DIAGNOSIS — J9620 Acute and chronic respiratory failure, unspecified whether with hypoxia or hypercapnia: Secondary | ICD-10-CM | POA: Diagnosis not present

## 2015-08-02 DIAGNOSIS — Z8249 Family history of ischemic heart disease and other diseases of the circulatory system: Secondary | ICD-10-CM | POA: Diagnosis not present

## 2015-08-02 DIAGNOSIS — N179 Acute kidney failure, unspecified: Principal | ICD-10-CM

## 2015-08-02 DIAGNOSIS — Z9861 Coronary angioplasty status: Secondary | ICD-10-CM | POA: Diagnosis not present

## 2015-08-02 DIAGNOSIS — Z9889 Other specified postprocedural states: Secondary | ICD-10-CM

## 2015-08-02 DIAGNOSIS — N281 Cyst of kidney, acquired: Secondary | ICD-10-CM | POA: Diagnosis present

## 2015-08-02 DIAGNOSIS — Z87891 Personal history of nicotine dependence: Secondary | ICD-10-CM | POA: Diagnosis not present

## 2015-08-02 DIAGNOSIS — I959 Hypotension, unspecified: Secondary | ICD-10-CM | POA: Diagnosis present

## 2015-08-02 DIAGNOSIS — R059 Cough, unspecified: Secondary | ICD-10-CM

## 2015-08-02 DIAGNOSIS — I5032 Chronic diastolic (congestive) heart failure: Secondary | ICD-10-CM | POA: Diagnosis present

## 2015-08-02 DIAGNOSIS — I447 Left bundle-branch block, unspecified: Secondary | ICD-10-CM | POA: Diagnosis not present

## 2015-08-02 DIAGNOSIS — Z833 Family history of diabetes mellitus: Secondary | ICD-10-CM

## 2015-08-02 DIAGNOSIS — Z8679 Personal history of other diseases of the circulatory system: Secondary | ICD-10-CM | POA: Diagnosis not present

## 2015-08-02 DIAGNOSIS — Z95810 Presence of automatic (implantable) cardiac defibrillator: Secondary | ICD-10-CM

## 2015-08-02 DIAGNOSIS — Z7982 Long term (current) use of aspirin: Secondary | ICD-10-CM

## 2015-08-02 DIAGNOSIS — Z87442 Personal history of urinary calculi: Secondary | ICD-10-CM | POA: Diagnosis not present

## 2015-08-02 DIAGNOSIS — J449 Chronic obstructive pulmonary disease, unspecified: Secondary | ICD-10-CM | POA: Diagnosis present

## 2015-08-02 DIAGNOSIS — R509 Fever, unspecified: Secondary | ICD-10-CM

## 2015-08-02 LAB — CBC WITH DIFFERENTIAL/PLATELET
BASOS ABS: 0 10*3/uL (ref 0.0–0.1)
Basophils Relative: 0 %
EOS ABS: 0.2 10*3/uL (ref 0.0–0.7)
Eosinophils Relative: 2 %
HEMATOCRIT: 34.5 % — AB (ref 39.0–52.0)
Hemoglobin: 12.3 g/dL — ABNORMAL LOW (ref 13.0–17.0)
LYMPHS ABS: 1 10*3/uL (ref 0.7–4.0)
Lymphocytes Relative: 9 %
MCH: 33.6 pg (ref 26.0–34.0)
MCHC: 35.7 g/dL (ref 30.0–36.0)
MCV: 94.3 fL (ref 78.0–100.0)
MONO ABS: 0.8 10*3/uL (ref 0.1–1.0)
Monocytes Relative: 7 %
NEUTROS ABS: 9 10*3/uL — AB (ref 1.7–7.7)
Neutrophils Relative %: 82 %
PLATELETS: 188 10*3/uL (ref 150–400)
RBC: 3.66 MIL/uL — AB (ref 4.22–5.81)
RDW: 12.9 % (ref 11.5–15.5)
WBC: 11 10*3/uL — AB (ref 4.0–10.5)

## 2015-08-02 LAB — COMPREHENSIVE METABOLIC PANEL
ALBUMIN: 2.6 g/dL — AB (ref 3.5–5.0)
ALT: 24 U/L (ref 17–63)
ANION GAP: 12 (ref 5–15)
AST: 20 U/L (ref 15–41)
Alkaline Phosphatase: 76 U/L (ref 38–126)
BILIRUBIN TOTAL: 1.3 mg/dL — AB (ref 0.3–1.2)
BUN: 44 mg/dL — ABNORMAL HIGH (ref 6–20)
CHLORIDE: 94 mmol/L — AB (ref 101–111)
CO2: 28 mmol/L (ref 22–32)
Calcium: 8.9 mg/dL (ref 8.9–10.3)
Creatinine, Ser: 3.42 mg/dL — ABNORMAL HIGH (ref 0.61–1.24)
GFR calc Af Amer: 19 mL/min — ABNORMAL LOW (ref >60)
GFR calc non Af Amer: 16 mL/min — ABNORMAL LOW (ref >60)
GLUCOSE: 178 mg/dL — AB (ref 65–99)
POTASSIUM: 3.9 mmol/L (ref 3.5–5.1)
SODIUM: 134 mmol/L — AB (ref 135–145)
TOTAL PROTEIN: 6.4 g/dL — AB (ref 6.5–8.1)

## 2015-08-02 LAB — MRSA PCR SCREENING: MRSA BY PCR: NEGATIVE

## 2015-08-02 LAB — PROTEIN / CREATININE RATIO, URINE
CREATININE, URINE: 34.55 mg/dL
Protein Creatinine Ratio: 0.52 mg/mg{Cre} — ABNORMAL HIGH (ref 0.00–0.15)
TOTAL PROTEIN, URINE: 18 mg/dL

## 2015-08-02 LAB — GLUCOSE, CAPILLARY: GLUCOSE-CAPILLARY: 174 mg/dL — AB (ref 65–99)

## 2015-08-02 LAB — MAGNESIUM: Magnesium: 1.9 mg/dL (ref 1.7–2.4)

## 2015-08-02 LAB — NA AND K (SODIUM & POTASSIUM), RAND UR
Potassium Urine: 13 mmol/L
SODIUM UR: 65 mmol/L

## 2015-08-02 LAB — PHOSPHORUS: Phosphorus: 5 mg/dL — ABNORMAL HIGH (ref 2.5–4.6)

## 2015-08-02 LAB — PROTEIN, URINE, RANDOM: TOTAL PROTEIN, URINE: 18 mg/dL

## 2015-08-02 MED ORDER — INSULIN ASPART 100 UNIT/ML ~~LOC~~ SOLN
0.0000 [IU] | Freq: Three times a day (TID) | SUBCUTANEOUS | Status: DC
  Administered 2015-08-03 – 2015-08-06 (×6): 1 [IU] via SUBCUTANEOUS
  Administered 2015-08-06: 2 [IU] via SUBCUTANEOUS
  Administered 2015-08-07: 1 [IU] via SUBCUTANEOUS

## 2015-08-02 MED ORDER — FINASTERIDE 5 MG PO TABS
5.0000 mg | ORAL_TABLET | Freq: Every day | ORAL | Status: DC
  Administered 2015-08-03 – 2015-08-11 (×9): 5 mg via ORAL
  Filled 2015-08-02 (×9): qty 1

## 2015-08-02 MED ORDER — IPRATROPIUM-ALBUTEROL 0.5-2.5 (3) MG/3ML IN SOLN
3.0000 mL | Freq: Four times a day (QID) | RESPIRATORY_TRACT | Status: DC
  Administered 2015-08-02: 3 mL via RESPIRATORY_TRACT
  Filled 2015-08-02: qty 3

## 2015-08-02 MED ORDER — PANTOPRAZOLE SODIUM 40 MG PO TBEC
40.0000 mg | DELAYED_RELEASE_TABLET | Freq: Every day | ORAL | Status: DC
  Administered 2015-08-03 – 2015-08-11 (×9): 40 mg via ORAL
  Filled 2015-08-02 (×9): qty 1

## 2015-08-02 MED ORDER — LEVOFLOXACIN IN D5W 500 MG/100ML IV SOLN
500.0000 mg | INTRAVENOUS | Status: DC
  Filled 2015-08-02: qty 100

## 2015-08-02 MED ORDER — ENOXAPARIN SODIUM 40 MG/0.4ML ~~LOC~~ SOLN: 40.0000 mg | SUBCUTANEOUS | Status: DC

## 2015-08-02 MED ORDER — ONDANSETRON HCL 4 MG/2ML IJ SOLN: 4.0000 mg | Freq: Four times a day (QID) | INTRAMUSCULAR | Status: DC | PRN

## 2015-08-02 MED ORDER — ONDANSETRON HCL 4 MG PO TABS: 4.0000 mg | ORAL_TABLET | Freq: Four times a day (QID) | ORAL | Status: DC | PRN

## 2015-08-02 MED ORDER — AZITHROMYCIN 500 MG IV SOLR
500.0000 mg | INTRAVENOUS | Status: DC
  Administered 2015-08-02: 500 mg via INTRAVENOUS
  Filled 2015-08-02: qty 500

## 2015-08-02 MED ORDER — SODIUM CHLORIDE 0.9 % IV SOLN
INTRAVENOUS | Status: AC
  Administered 2015-08-02: 75 mL/h via INTRAVENOUS

## 2015-08-02 MED ORDER — IPRATROPIUM-ALBUTEROL 0.5-2.5 (3) MG/3ML IN SOLN
3.0000 mL | RESPIRATORY_TRACT | Status: DC | PRN
  Administered 2015-08-06: 3 mL via RESPIRATORY_TRACT
  Filled 2015-08-02: qty 3

## 2015-08-02 MED ORDER — IPRATROPIUM-ALBUTEROL 0.5-2.5 (3) MG/3ML IN SOLN
3.0000 mL | Freq: Two times a day (BID) | RESPIRATORY_TRACT | Status: DC
  Administered 2015-08-03 – 2015-08-05 (×5): 3 mL via RESPIRATORY_TRACT
  Filled 2015-08-02 (×5): qty 3

## 2015-08-02 MED ORDER — DEXTROSE 5 % IV SOLN
1.0000 g | Freq: Two times a day (BID) | INTRAVENOUS | Status: DC
  Administered 2015-08-02 – 2015-08-03 (×2): 1 g via INTRAVENOUS
  Filled 2015-08-02 (×3): qty 1

## 2015-08-02 NOTE — Progress Notes (Signed)
RT Assessment: Pt is resting comfortably at this time. Wearing 2L [place] (Sats 100%). Vitals: HR: 95, RR: 20. Pt denies SOB. WOB normal. BIPAP order is PRN. Pt understands to call RN/RT if he notices a change in breathing. Pt states he only take breathing treatments at home BID. Pt has clear BBS. Pt wishes to keep Home schedule for DuoNeb and be able to request PRN treatments if needed. RT will monitor.

## 2015-08-03 ENCOUNTER — Inpatient Hospital Stay (HOSPITAL_COMMUNITY): Payer: Medicare HMO

## 2015-08-03 DIAGNOSIS — N179 Acute kidney failure, unspecified: Principal | ICD-10-CM

## 2015-08-03 LAB — CBC
HEMATOCRIT: 34.3 % — AB (ref 39.0–52.0)
HEMOGLOBIN: 12.1 g/dL — AB (ref 13.0–17.0)
MCH: 33.3 pg (ref 26.0–34.0)
MCHC: 35.3 g/dL (ref 30.0–36.0)
MCV: 94.5 fL (ref 78.0–100.0)
Platelets: 199 10*3/uL (ref 150–400)
RBC: 3.63 MIL/uL — ABNORMAL LOW (ref 4.22–5.81)
RDW: 13 % (ref 11.5–15.5)
WBC: 11 10*3/uL — AB (ref 4.0–10.5)

## 2015-08-03 LAB — COMPREHENSIVE METABOLIC PANEL
ALBUMIN: 2.7 g/dL — AB (ref 3.5–5.0)
ALK PHOS: 73 U/L (ref 38–126)
ALT: 24 U/L (ref 17–63)
ANION GAP: 11 (ref 5–15)
AST: 21 U/L (ref 15–41)
BILIRUBIN TOTAL: 1.3 mg/dL — AB (ref 0.3–1.2)
BUN: 42 mg/dL — ABNORMAL HIGH (ref 6–20)
CALCIUM: 8.8 mg/dL — AB (ref 8.9–10.3)
CO2: 27 mmol/L (ref 22–32)
Chloride: 96 mmol/L — ABNORMAL LOW (ref 101–111)
Creatinine, Ser: 3.35 mg/dL — ABNORMAL HIGH (ref 0.61–1.24)
GFR, EST AFRICAN AMERICAN: 19 mL/min — AB (ref >60)
GFR, EST NON AFRICAN AMERICAN: 16 mL/min — AB (ref >60)
Glucose, Bld: 184 mg/dL — ABNORMAL HIGH (ref 65–99)
POTASSIUM: 3.5 mmol/L (ref 3.5–5.1)
Sodium: 134 mmol/L — ABNORMAL LOW (ref 135–145)
TOTAL PROTEIN: 6.9 g/dL (ref 6.5–8.1)

## 2015-08-03 LAB — GLUCOSE, CAPILLARY
GLUCOSE-CAPILLARY: 104 mg/dL — AB (ref 65–99)
GLUCOSE-CAPILLARY: 105 mg/dL — AB (ref 65–99)
Glucose-Capillary: 144 mg/dL — ABNORMAL HIGH (ref 65–99)
Glucose-Capillary: 100 mg/dL — ABNORMAL HIGH (ref 65–99)

## 2015-08-03 MED ORDER — ENOXAPARIN SODIUM 30 MG/0.3ML ~~LOC~~ SOLN
30.0000 mg | SUBCUTANEOUS | Status: DC
  Administered 2015-08-03 – 2015-08-11 (×8): 30 mg via SUBCUTANEOUS
  Filled 2015-08-03 (×9): qty 0.3

## 2015-08-03 MED ORDER — CEFTAZIDIME 1 G IJ SOLR
1.0000 g | INTRAMUSCULAR | Status: DC
  Administered 2015-08-04 – 2015-08-07 (×4): 1 g via INTRAVENOUS
  Filled 2015-08-03 (×4): qty 1

## 2015-08-03 NOTE — H&P (Signed)
[facility] History and Physical  Kenneth [name] IOE:[id number] DOB: Oct 28, 1936 DOA: 08/02/2015  Referring physician:  PCP: [name]., MD   Chief Complaint: Transfer for evaluation of acute kidney injury  HPI: Kenneth [name] is a 79 y.o. male  with a past medical history of AAA, CAD, hypertension, lung cancer (status post partial  left upper lobe resection), COPD, type 2 diabetes, hyperlipidemia who is being transferred from [facility] due to acute kidney injury. The patient had been treated there for a week for bilateral pneumonia with vancomycin and Zosyn. He was also taking his regular antihypertensive medications that include furosemide, valsartan and hydrochlorothiazide. He had been steadily improving, but 2 days ago started complaining about weakness and nausea. His creatinine was normal on admission and remained normal at least until 07/30/2015, but today his creatinine was 3.72. He was also found to be hypotensive earlier today and received several fluid boluses which improved patient's blood pressure and urine output. He is currently in no acute distress.   Review of Systems:  Constitutional:  Positive fatigue.  No weight loss, night sweats, Fevers, chills,  HEENT:  No headaches, Difficulty swallowing,Tooth/dental problems,Sore throat,  No sneezing, itching, ear ache, nasal congestion, post nasal drip,  Cardio-vascular:  Positive dizziness No chest pain, Orthopnea, PND, swelling in lower extremities, anasarca, , palpitations  GI:  Positive nausea, loss of appetite No heartburn, indigestion, abdominal pain, vomiting, diarrhea, change in bowel habits. Resp:  Positive shortness of breath with exertion or at rest.  Positive productive cough, positive hemoptysis initially when admitted for pneumonia treatment. Positive wheezing.No chest wall deformity  Skin:  No rash or lesions.  GU:  No dysuria, change in color of urine, no urgency or frequency.  No flank pain.  Musculoskeletal:  No joint pain or swelling. No decreased range of motion. No back pain.  Psych:  No change in mood or affect. No depression or anxiety. No memory loss.   Past Medical History  Diagnosis Date  . Abdominal aortic aneurysm without mention of rupture   . Kidney stones 05/03/09    HEMATURIA,CYSTOSCOPY DONE  . CAD (coronary artery disease), native coronary artery 03/10/2014    02/28/10 AVR with tissue valve, Cabg with LIMA to LAD, SVG to int, and SVG to RCA Dr. Roxan Hockey  Cath 02/16/2010 Calcified coronaries  LM calcified, LAD, diffuse disease, circ calcified with 60% mid, RCA calcified with 80% prox and 50% mid  36 mm Peak gradient, severe aortic stenosis  2003 Cypher stent in distal RCA , PTCA of diagonal 1996 PTCA of RCA in 2 places with restenosis treated again   . Hypertensive heart disease   . Diabetes mellitus type 2, noninsulin dependent 03/10/2014  . COPD   . Chronic dyspnea   . Chronic diastolic heart failure   . Lumbar disc disease   . Hyperlipidemia   . History of lung cancer 12/13/2005    LUL lobe Stage 1 non small cell cancer treated with resection Dr. [name]   . GERD 08/06/2008  . Atrial fibrillation   . Cancer 2007    Lung  . Hypertension   . LBBB (left bundle branch block)   . AICD (automatic cardioverter/defibrillator) present 04/13/2015    icd   . CHF (congestive heart failure)    Past Surgical History  Procedure Laterality Date  . Coronary artery bypass graft  02/2010  . Tissue aortic valve replacement  02/2010  . Tonsillectomy    . Lul resection  12/2005  DR, [name]  Lung Surgery  . Colonoscopy  2008    NORMAL  . Ep implantable device N/A 04/13/2015    Procedure: BiV ICD Insertion CRT-D;  Surgeon: [name], MD;  Location: [facility];  Service: Cardiovascular;  Laterality: N/A;  . Cataract extraction Right    Social History:  reports that he quit smoking about 9 years ago. His smoking use included Cigarettes. He has a 50  pack-year smoking history. He has never used smokeless tobacco. He reports that he does not drink alcohol or use illicit drugs.  Allergies  Allergen Reactions  . Ciprocin-Fluocin-Procin [Fluocinolone] Nausea Only  . Clarithromycin Nausea Only    Altered taste  . Hydrocodone-Acetaminophen Nausea Only    Family History  Problem Relation Age of Onset  . Heart disease Father     MI/CABG  . Hyperlipidemia Father   . Hypertension Father   . Heart attack Father     Several attacks  . Diabetes Father   . Cancer Mother     Breast  . Dementia Mother   . Heart disease Brother     Before age 84  . Hyperlipidemia Brother   . Hypertension Brother   . Heart attack Brother   . Peripheral vascular disease Brother   . Diabetes Daughter     Prior to Admission medications   Medication Sig Start Date End Date Taking? Authorizing Provider  albuterol (PROVENTIL) (2.5 MG/3ML) 0.083% nebulizer solution Take 2.5 mg by nebulization every 6 (six) hours as needed for wheezing or shortness of breath.  08/03/13   Historical Provider, MD  aspirin 81 MG tablet Take 81 mg by mouth daily.     Historical Provider, MD  fexofenadine (ALLEGRA) 180 MG tablet Take 180 mg by mouth daily as needed for allergies.     Historical Provider, MD  finasteride (PROSCAR) 5 MG tablet Take 5 mg by mouth daily.  03/01/15   Historical Provider, MD  furosemide (LASIX) 40 MG tablet Take 40 mg by mouth 2 (two) times daily.  02/21/15   Historical Provider, MD  nebivolol (BYSTOLIC) 5 MG tablet Take 5 mg by mouth daily.    Historical Provider, MD  omeprazole (PRILOSEC) 40 MG capsule Take 40 mg by mouth daily.  12/10/14   Historical Provider, MD  potassium chloride (K-DUR,KLOR-CON) 10 MEQ tablet Take 10 mEq by mouth every other day.  02/14/15   Historical Provider, MD  valsartan-hydrochlorothiazide (DIOVAN-HCT) 320-25 MG per tablet Take 0.5 tablets by mouth daily.    Historical Provider, MD   Physical Exam: Filed Vitals:   08/03/15 0200  08/03/15 0300 08/03/15 0356 08/03/15 0400  BP: 112/55 95/51 95/51  100/70  Pulse: 94 88 91 92  Temp:   98.4 F (36.9 C)   TempSrc:   Oral   Resp: 18 26 21 25   Height:      Weight:      SpO2: 93% 98% 97% 97%    Wt Readings from Last 3 Encounters:  08/02/15 68.7 kg (151 lb 7.3 oz)  05/24/15 70.308 kg (155 lb)  04/14/15 68.13 kg (150 lb 3.2 oz)    General:  Appears calm and comfortable Eyes: PERRL, normal lids, irises & conjunctiva ENT: grossly normal hearing, lips & tongue mildly dry. Neck: no LAD, masses or thyromegaly Cardiovascular: RRR, no m/r/g. No LE edema. Telemetry: SR, no arrhythmias  Respiratory: Bibasilar rales, mild rhonchi, no wheezing. Abdomen: soft, ntnd Skin: no rash or induration seen on limited exam Musculoskeletal: grossly normal tone  BUE/BLE Psychiatric: grossly normal mood and affect, speech fluent and appropriate Neurologic: grossly non-focal.          Labs on Admission:  Basic Metabolic Panel:  Recent Labs Lab 08/02/15 2211 08/03/15 0250  NA 134* 134*  K 3.9 3.5  CL 94* 96*  CO2 28 27  GLUCOSE 178* 184*  BUN 44* 42*  CREATININE 3.42* 3.35*  CALCIUM 8.9 8.8*  MG 1.9  --   PHOS 5.0*  --    Liver Function Tests:  Recent Labs Lab 08/02/15 2211 08/03/15 0250  AST 20 21  ALT 24 24  ALKPHOS 76 73  BILITOT 1.3* 1.3*  PROT 6.4* 6.9  ALBUMIN 2.6* 2.7*   No results for input(s): LIPASE, AMYLASE in the last 168 hours. No results for input(s): AMMONIA in the last 168 hours. CBC:  Recent Labs Lab 08/02/15 2211 08/03/15 0250  WBC 11.0* 11.0*  NEUTROABS 9.0*  --   HGB 12.3* 12.1*  HCT 34.5* 34.3*  MCV 94.3 94.5  PLT 188 199    CBG:  Recent Labs Lab 08/02/15 2235  GLUCAP 174*     Assessment/Plan Principal Problem:     AKI (acute kidney injury) The patient received 3 L of fluid and his blood pressure is better. Check echocardiogram to assess the left ventricle function. Continue careful IV hydration. Will try to get  renal ultrasound report from the transferring facility. Check urine random sodium, potassium, protein and creatinine. Antihypertensives have been already held. Consult nephrology in the morning.  Active Problems:     Hyperlipidemia Continue Lipitor and monitor LFTs.    COPD (chronic obstructive pulmonary disease)   Community-acquired pneumonia  Continue IV antibiotics, bronchodilators and supplemental oxygen.      History of lung cancer Patient only needed surgical treatment. Continue supplemental oxygen.      GERD Continue omeprazole.      Abdominal aortic aneurysm Continue close monitoring.    Diabetes mellitus type 2, noninsulin dependent CBG monitoring with regular insulin sliding scale.       Hypertension Monitor blood pressure and all antihypertensive therapy at this time. Resume therapy once the blood pressure is better.    Code Status: Full code. DVT Prophylaxis: Lovenox SQ. Family Communication:  Disposition Plan: Admit to a stepdown, continue IV antibiotics and consult nephrology.  Time spent: Over 70 minutes were spent during the process of this admission.  Reubin Milan [facility] Pager [phone].

## 2015-08-03 NOTE — Care Management Note (Signed)
Case Management Note  Patient Details  Name: Kenneth [name] MRN: [id number] Date of Birth: [birth year]  Subjective/Objective:      Adm w kidney injury, hypotension              Action/Plan: lives w fam, pcp dr vyas   Expected Discharge Date:                  Expected Discharge Plan:  [place]  In-House Referral:     Discharge planning Services     Post Acute Care Choice:    Choice offered to:     DME Arranged:    DME Agency:     HH Arranged:    [place]:     Status of Service:     Medicare Important Message Given:    Date Medicare IM Given:    Medicare IM give by:    Date Additional Medicare IM Given:    Additional Medicare Important Message give by:     If discussed at [place] of Stay Meetings, dates discussed:    Additional Comments: ur review done  Lacretia Leigh, RN 08/03/2015, 8:07 AM

## 2015-08-03 NOTE — Progress Notes (Signed)
Pt complaining of pain up and down left arm where Zithromax is infusing at 250/hr. Decreased rate to 50/hr. Pt continuing to complain of arm pain stating it hurts so bad he is unable to sleep and requesting the IV be turned completely off. Called pharmacy and was told that wasn't a known side effect of the medicine. As of now the pump is turned off. Will continue to monitor.

## 2015-08-03 NOTE — Care Management Important Message (Signed)
Important Message  Patient Details  Name: Kenneth [name] MRN: [id number] Date of Birth: [birth year]   Medicare Important Message Given:  Yes-second notification given    Delorse Lek 08/03/2015, 12:08 PM

## 2015-08-03 NOTE — Progress Notes (Signed)
[facility] PROGRESS NOTE  FOWLER ANTOS TIW:[id number] DOB: December 06, 1935 DOA: 08/02/2015  PCP: [name]., MD  Brief HPI: 79 year old Caucasian male with a past medical history of AAA, currently, artery disease, hypertension, COPD, type 2 diabetes who was admitted to [facility] with pneumonia. He had been there for a week. He was on vancomycin and Zosyn. Patient had been improving and then was noted to have elevation in his creatinine. Creatinine was normal at the time of admission and then it was up to 3.72. Family requested transfer to [facility].  Past medical history:  Past Medical History  Diagnosis Date  . Abdominal aortic aneurysm without mention of rupture   . Kidney stones 05/03/09    HEMATURIA,CYSTOSCOPY DONE  . CAD (coronary artery disease), native coronary artery 03/10/2014    02/28/10 AVR with tissue valve, Cabg with LIMA to LAD, SVG to int, and SVG to RCA Dr. Roxan Hockey  Cath 02/16/2010 Calcified coronaries  LM calcified, LAD, diffuse disease, circ calcified with 60% mid, RCA calcified with 80% prox and 50% mid  36 mm Peak gradient, severe aortic stenosis  2003 Cypher stent in distal RCA , PTCA of diagonal 1996 PTCA of RCA in 2 places with restenosis treated again   . Hypertensive heart disease   . Diabetes mellitus type 2, noninsulin dependent 03/10/2014  . COPD   . Chronic dyspnea   . Chronic diastolic heart failure   . Lumbar disc disease   . Hyperlipidemia   . History of lung cancer 12/13/2005    LUL lobe Stage 1 non small cell cancer treated with resection Dr. [name]   . GERD 08/06/2008  . Atrial fibrillation   . Cancer 2007    Lung  . Hypertension   . LBBB (left bundle branch block)   . AICD (automatic cardioverter/defibrillator) present 04/13/2015    icd   . CHF (congestive heart failure)     Consultants: None  Procedures: None  Antibiotics: Previously on vancomycin and Zosyn in [facility]. He was also on  azithromycin, which he did not tolerate.  Now only on Fortaz  Subjective: Patient states that he developed a lot of burning sensation in his arm when azithromycin was being infused. He also had some difficulty breathing. But feeling much better this morning. Denies any chest pain, abdominal pain. His urinating.  Objective: Vital Signs  Filed Vitals:   08/03/15 0600 08/03/15 0700 08/03/15 0900 08/03/15 1137  BP: 110/55 106/53  113/70  Pulse: 93 89  90  Temp:  98.3 F (36.8 C)  97.9 F (36.6 C)  TempSrc:  Oral  Oral  Resp: 22 27  20   Height:      Weight:      SpO2: 98% 96% 98% 97%    Intake/Output Summary (Last 24 hours) at 08/03/15 1308 Last data filed at 08/03/15 1200  Gross per 24 hour  Intake 1018.75 ml  Output   3550 ml  Net -2531.25 ml   Filed Weights   08/02/15 1845  Weight: 68.7 kg (151 lb 7.3 oz)    General appearance: alert, cooperative, appears stated age and no distress Resp: clear to auscultation bilaterally Cardio: regular rate and rhythm, S1, S2 normal, no murmur, click, rub or gallop GI: soft, non-tender; bowel sounds normal; no masses,  no organomegaly Extremities: extremities normal, atraumatic, no cyanosis or edema Neurologic: Alert and oriented 3. No focal neurological deficits are noted.  Lab Results:  Basic Metabolic Panel:  Recent Labs Lab  08/02/15 2211 08/03/15 0250  NA 134* 134*  K 3.9 3.5  CL 94* 96*  CO2 28 27  GLUCOSE 178* 184*  BUN 44* 42*  CREATININE 3.42* 3.35*  CALCIUM 8.9 8.8*  MG 1.9  --   PHOS 5.0*  --    Liver Function Tests:  Recent Labs Lab 08/02/15 2211 08/03/15 0250  AST 20 21  ALT 24 24  ALKPHOS 76 73  BILITOT 1.3* 1.3*  PROT 6.4* 6.9  ALBUMIN 2.6* 2.7*   CBC:  Recent Labs Lab 08/02/15 2211 08/03/15 0250  WBC 11.0* 11.0*  NEUTROABS 9.0*  --   HGB 12.3* 12.1*  HCT 34.5* 34.3*  MCV 94.3 94.5  PLT 188 199    CBG:  Recent Labs Lab 08/02/15 2235 08/03/15 0751  GLUCAP 174* 144*     Recent Results (from the past 240 hour(s))  MRSA PCR Screening     Status: None   Collection Time: 08/02/15  8:18 PM  Result Value Ref Range Status   MRSA by PCR NEGATIVE NEGATIVE Final    Comment:        The GeneXpert MRSA Assay (FDA approved for NASAL specimens only), is one component of a comprehensive MRSA colonization surveillance program. It is not intended to diagnose MRSA infection nor to guide or monitor treatment for MRSA infections.       Studies/Results: No results found.  Medications:  Scheduled: . [START ON 08/04/2015] cefTAZidime (FORTAZ)  IV  1 g Intravenous Q24H  . enoxaparin (LOVENOX) injection  30 mg Subcutaneous Q24H  . finasteride  5 mg Oral Daily  . insulin aspart  0-9 Units Subcutaneous TID WC  . ipratropium-albuterol  3 mL Nebulization BID  . pantoprazole  40 mg Oral Q1200   Continuous: . sodium chloride 75 mL/hr at 08/03/15 1003   LZJ:[id number], ondansetron **OR** ondansetron (ZOFRAN) IV  Assessment/Plan:  Principal Problem:   AKI (acute kidney injury) Active Problems:   Hyperlipidemia   COPD (chronic obstructive pulmonary disease)   GERD   History of lung cancer   Abdominal aortic aneurysm   Diabetes mellitus type 2, noninsulin dependent    Acute renal failure Thought to be secondary to ATN from hypotension. He was also on nephrotoxic agents including vancomycin. He was also getting Ace inhibitors and diuretics. Blood pressure has improved. Creatinine slightly better today. Renal ultrasound will be ordered. Urine electrolyte studies have been reviewed. However, they are hard to interpret as the patient has received multiple fluid boluses. Continue Foley catheter for now. We will consult nephrology if renal function does not improve as anticipated. He was seen by nephrologist at [facility]. Their consult note was reviewed.  Healthcare associated pneumonia Patient completed almost a week of vancomycin and  Zosyn. No positive blood culture reports have been noted. Patient was started on Fortaz and azithromycin here. He did not tolerate the IV infusion of azithromycin. This will be discontinued. Continue only Tressie Ellis for now.  History of COPD Stable. Continue bronchodilating as needed.  History of lung cancer  He is status post surgical removal in the past.  History of abdominal aortic aneurysm Followed closely by vascular surgery.   History of type 2 diabetes Continue to monitor CBGs. Continue sliding scale coverage.  History of essential hypertension  Patient's blood pressure was low at [facility]. Has improved with IV fluids. His antihypertensives are on hold.   History of ischemic cardiomyopathy with prior bypass  Ejection fraction is known to be about 35%. He also  has had aortic valve replacement. He had a AICD placed in June. Be cautious with IV fluids.   DVT Prophylaxis: Lovenox     Code Status: Full code Family Communication: Discussed with the patient  Disposition Plan: Continue current treatment. Could be transferred to the floor later today.     LOS: 1 day   [facility] Pager [phone] 08/03/2015, 1:08 PM  If 7PM-7AM, please contact night-coverage at [email or web address], password [facility]

## 2015-08-04 DIAGNOSIS — E119 Type 2 diabetes mellitus without complications: Secondary | ICD-10-CM

## 2015-08-04 DIAGNOSIS — I714 Abdominal aortic aneurysm, without rupture: Secondary | ICD-10-CM

## 2015-08-04 DIAGNOSIS — J189 Pneumonia, unspecified organism: Secondary | ICD-10-CM

## 2015-08-04 LAB — CBC
HCT: 33.8 % — ABNORMAL LOW (ref 39.0–52.0)
Hemoglobin: 11.6 g/dL — ABNORMAL LOW (ref 13.0–17.0)
MCH: 33 pg (ref 26.0–34.0)
MCHC: 34.3 g/dL (ref 30.0–36.0)
MCV: 96.3 fL (ref 78.0–100.0)
PLATELETS: 220 10*3/uL (ref 150–400)
RBC: 3.51 MIL/uL — ABNORMAL LOW (ref 4.22–5.81)
RDW: 12.9 % (ref 11.5–15.5)
WBC: 12.5 10*3/uL — AB (ref 4.0–10.5)

## 2015-08-04 LAB — GLUCOSE, CAPILLARY
GLUCOSE-CAPILLARY: 103 mg/dL — AB (ref 65–99)
GLUCOSE-CAPILLARY: 116 mg/dL — AB (ref 65–99)
Glucose-Capillary: 135 mg/dL — ABNORMAL HIGH (ref 65–99)
Glucose-Capillary: 147 mg/dL — ABNORMAL HIGH (ref 65–99)

## 2015-08-04 LAB — BASIC METABOLIC PANEL
ANION GAP: 8 (ref 5–15)
BUN: 39 mg/dL — ABNORMAL HIGH (ref 6–20)
CALCIUM: 8.6 mg/dL — AB (ref 8.9–10.3)
CO2: 27 mmol/L (ref 22–32)
CREATININE: 2.89 mg/dL — AB (ref 0.61–1.24)
Chloride: 101 mmol/L (ref 101–111)
GFR, EST AFRICAN AMERICAN: 23 mL/min — AB (ref >60)
GFR, EST NON AFRICAN AMERICAN: 20 mL/min — AB (ref >60)
Glucose, Bld: 122 mg/dL — ABNORMAL HIGH (ref 65–99)
Potassium: 4.1 mmol/L (ref 3.5–5.1)
Sodium: 136 mmol/L (ref 135–145)

## 2015-08-04 NOTE — Progress Notes (Signed)
[facility] PROGRESS NOTE  Kenneth [name] AYT:[id number] DOB: 1936-03-05 DOA: 08/02/2015  PCP: [name]., MD  Brief HPI: 79 year old Caucasian male with a past medical history of AAA, currently, artery disease, hypertension, COPD, type 2 diabetes who was admitted to [facility] with pneumonia. He had been there for a week. He was on vancomycin and Zosyn. Patient had been improving and then was noted to have elevation in his creatinine. Creatinine was normal at the time of admission and then it was up to 3.72. Family requested transfer to [facility].  Past medical history:  Past Medical History  Diagnosis Date  . Abdominal aortic aneurysm without mention of rupture   . Kidney stones 05/03/09    HEMATURIA,CYSTOSCOPY DONE  . CAD (coronary artery disease), native coronary artery 03/10/2014    02/28/10 AVR with tissue valve, Cabg with LIMA to LAD, SVG to int, and SVG to RCA Dr. Roxan Hockey  Cath 02/16/2010 Calcified coronaries  LM calcified, LAD, diffuse disease, circ calcified with 60% mid, RCA calcified with 80% prox and 50% mid  36 mm Peak gradient, severe aortic stenosis  2003 Cypher stent in distal RCA , PTCA of diagonal 1996 PTCA of RCA in 2 places with restenosis treated again   . Hypertensive heart disease   . Diabetes mellitus type 2, noninsulin dependent 03/10/2014  . COPD   . Chronic dyspnea   . Chronic diastolic heart failure   . Lumbar disc disease   . Hyperlipidemia   . History of lung cancer 12/13/2005    LUL lobe Stage 1 non small cell cancer treated with resection Dr. [name]   . GERD 08/06/2008  . Atrial fibrillation   . Cancer 2007    Lung  . Hypertension   . LBBB (left bundle branch block)   . AICD (automatic cardioverter/defibrillator) present 04/13/2015    icd   . CHF (congestive heart failure)     Consultants: None  Procedures: None  Antibiotics: Previously on vancomycin and Zosyn in [facility]. He was also on  azithromycin, which he did not tolerate.  Now only on Fortaz  Subjective: Patient starting to feel better. Making urine. Denies any chest pain, abdominal pain, nausea, vomiting. Still gets short of breath with minimal exertion. Cough is improved.   Objective: Vital Signs  Filed Vitals:   08/03/15 2339 08/03/15 2345 08/04/15 0400 08/04/15 0735  BP:  88/53  131/75  Pulse:  106  91  Temp: 98.1 F (36.7 C)  98 F (36.7 C) 98.1 F (36.7 C)  TempSrc: Oral  Oral Oral  Resp:  24  24  Height:      Weight:      SpO2:  93%  94%    Intake/Output Summary (Last 24 hours) at 08/04/15 3557 Last data filed at 08/04/15 0700  Gross per 24 hour  Intake 2343.75 ml  Output   2000 ml  Net 343.75 ml   Filed Weights   08/02/15 1845  Weight: 68.7 kg (151 lb 7.3 oz)    General appearance: alert, cooperative, appears stated age and no distress Resp: clear to auscultation bilaterally Cardio: regular rate and rhythm, S1, S2 normal, no murmur, click, rub or gallop GI: soft, non-tender; bowel sounds normal; no masses,  no organomegaly Extremities: extremities normal, atraumatic, no cyanosis or edema Neurologic: Alert and oriented 3. No focal neurological deficits are noted.  Lab Results:  Basic Metabolic Panel:  Recent Labs Lab 08/02/15 2211 08/03/15 0250 08/04/15 0259  NA  134* 134* 136  K 3.9 3.5 4.1  CL 94* 96* 101  CO2 28 27 27   GLUCOSE 178* 184* 122*  BUN 44* 42* 39*  CREATININE 3.42* 3.35* 2.89*  CALCIUM 8.9 8.8* 8.6*  MG 1.9  --   --   PHOS 5.0*  --   --    Liver Function Tests:  Recent Labs Lab 08/02/15 2211 08/03/15 0250  AST 20 21  ALT 24 24  ALKPHOS 76 73  BILITOT 1.3* 1.3*  PROT 6.4* 6.9  ALBUMIN 2.6* 2.7*   CBC:  Recent Labs Lab 08/02/15 2211 08/03/15 0250 08/04/15 0259  WBC 11.0* 11.0* 12.5*  NEUTROABS 9.0*  --   --   HGB 12.3* 12.1* 11.6*  HCT 34.5* 34.3* 33.8*  MCV 94.3 94.5 96.3  PLT 188 199 220    CBG:  Recent Labs Lab 08/02/15 2235  08/03/15 0751 08/03/15 1137 08/03/15 1634 08/03/15 2133  GLUCAP 174* 144* 100* 104* 105*    Recent Results (from the past 240 hour(s))  MRSA PCR Screening     Status: None   Collection Time: 08/02/15  8:18 PM  Result Value Ref Range Status   MRSA by PCR NEGATIVE NEGATIVE Final    Comment:        The GeneXpert MRSA Assay (FDA approved for NASAL specimens only), is one component of a comprehensive MRSA colonization surveillance program. It is not intended to diagnose MRSA infection nor to guide or monitor treatment for MRSA infections.       Studies/Results: US Renal  08/03/2015   CLINICAL DATA:  Acute renal failure.  Diabetes and hypertension.  EXAM: RENAL / URINARY TRACT ULTRASOUND COMPLETE  COMPARISON:  08/02/2015  FINDINGS: Right Kidney:  Length: 10.3 cm. Echogenicity within normal limits. No mass or hydronephrosis visualized.  Left Kidney:  Length: 10.8 cm. Echogenicity within normal limits. No mass or hydronephrosis visualized. Stable appearance of several renal cysts, 1 measuring up to 2.3 cm in diameter and the other measuring up to 2.6 cm in diameter. These appear simple.  Bladder:  Foley catheter noted in the urinary bladder.  Other: Incidentally seen during imaging is an infrarenal abdominal aortic aneurysm measuring 5.7 cm transverse by 5.2 cm anterior-posterior (formerly 5.2 by 4.9 cm on 09/07/2014).  IMPRESSION: 1. An infrarenal abdominal aortic aneurysm has enlarged and now measures 5.7 by 5.2 cm. Vascular surgery consultation recommended due to increased risk of rupture for AAA >5.5 cm. This recommendation follows ACR consensus guidelines: White Paper of the ACR Incidental Findings Committee II on Vascular Findings. J Am Coll Radiol 2013; 10:789-794. 2. Stable appearance of several small left renal cysts. No hydronephrosis; renal echogenicity and echotexture normal.   Electronically Signed   By: Van Clines M.D.   On: 08/03/2015 15:57    Medications:   Scheduled: . cefTAZidime (FORTAZ)  IV  1 g Intravenous Q24H  . enoxaparin (LOVENOX) injection  30 mg Subcutaneous Q24H  . finasteride  5 mg Oral Daily  . insulin aspart  0-9 Units Subcutaneous TID WC  . ipratropium-albuterol  3 mL Nebulization BID  . pantoprazole  40 mg Oral Q1200   Continuous: . sodium chloride 75 mL/hr at 08/03/15 2000   [id number]:[id number], ondansetron **OR** ondansetron (ZOFRAN) IV  Assessment/Plan:  Principal Problem:   AKI (acute kidney injury) Active Problems:   Hyperlipidemia   COPD (chronic obstructive pulmonary disease)   GERD   History of lung cancer   Abdominal aortic aneurysm   Diabetes mellitus type 2, noninsulin dependent  Acute renal failure Thought to be secondary to ATN from hypotension. He was also on nephrotoxic agents including vancomycin. He was also getting Ace inhibitors and diuretics. Blood pressure has improved. Creatinine is improving. He is making adequate amount of urine. Renal ultrasound report reviewed. No hydronephrosis noted. Renal cysts were identified. Urine electrolyte studies have been reviewed. However, they are hard to interpret as the patient has received multiple fluid boluses. Okay to discontinue Foley catheter. At this time we can hold off on nephrology consultation. He was seen by nephrologist at [facility]. Their consult note was reviewed.  Healthcare associated pneumonia Patient completed almost a week of vancomycin and Zosyn. No positive blood culture reports have been noted. Patient was started on Fortaz and azithromycin here. He did not tolerate the IV infusion of azithromycin. And so this was discontinued. Continue only Tressie Ellis for now.  History of COPD Stable. Continue bronchodilating as needed. He is on oxygen around the clock.  History of lung cancer  He is status post surgical removal in the past.  History of abdominal aortic aneurysm Followed closely by vascular surgery. AAA  findings noted on ultrasound. He was seen by Dr. Donnetta Hutching within the last 2 months.  History of type 2 diabetes Continue to monitor CBGs. Continue sliding scale coverage.  History of essential hypertension  Patient's blood pressure was low at [facility]. Has improved with IV fluids. His antihypertensives are on hold.   History of ischemic cardiomyopathy with prior bypass  Ejection fraction is known to be about 35%. He also has had aortic valve replacement. He had a AICD placed in June. Be cautious with IV fluids.   DVT Prophylaxis: Lovenox     Code Status: Full code Family Communication: Discussed with the patient  Disposition Plan: Asian is improving Could be transferred to the floor later today. Start mobilizing.     LOS: 2 days   [facility] Pager [phone] 08/04/2015, 7:38 AM  If 7PM-7AM, please contact night-coverage at [email or web address], password [facility]

## 2015-08-04 NOTE — Progress Notes (Signed)
Courtesy visit. Patienet well known to me and wife called yesterday and asked I staop by.  His breathing has significantly improved since the biventricular device earlier this summer.  Kerry Hough MD [facility]

## 2015-08-04 NOTE — Evaluation (Signed)
Occupational Therapy Evaluation Patient Details Name: ZAE KIRTZ MRN: [id number] DOB: 27-Jan-1936 Today's Date: 08/04/2015    History of Present Illness 79 y.o. male with a past medical history of AAA, CAD, hypertension, lung cancer (status post partial left upper lobe resection), COPD, type 2 diabetes, hyperlipidemia who is being transferred from [facility] due to acute kidney injury. The patient had been treated there for a week for bilateral pneumonia   Clinical Impression   Pt admitted with AAA. Pt currently with functional limitations due to the deficits listed below (see OT Problem List).  Pt will benefit from skilled OT to increase their safety and independence with ADL and functional mobility for ADL to facilitate discharge to venue listed below.      Follow Up Recommendations  No OT follow up    Equipment Recommendations  None recommended by OT       Precautions / Restrictions Precautions Precautions: None      Mobility Bed Mobility               General bed mobility comments: in chair on arrival  Transfers Overall transfer level: Needs assistance   Transfers: Sit to/from Stand Sit to Stand: Supervision         General transfer comment: S due to lines in small space in bathroom    Balance Overall balance assessment: No apparent balance deficits (not formally assessed)                                          ADL Overall ADL's : Needs assistance/impaired     Grooming: Wash/dry face;Wash/dry hands;Standing;Min guard               Lower Body Dressing: Min guard;Sit to/from stand   Toilet Transfer: Contractor- Water quality scientist and Hygiene: Min guard;Sit to/from stand       Functional mobility during ADLs: Minimal assistance                 Pertinent Vitals/Pain Pain Assessment: No/denies pain     Hand Dominance     Extremity/Trunk Assessment Upper  Extremity Assessment Upper Extremity Assessment: Overall WFL for tasks assessed   Lower Extremity Assessment Lower Extremity Assessment: Overall WFL for tasks assessed   Cervical / Trunk Assessment Cervical / Trunk Assessment: Normal   Communication Communication Communication: No difficulties   Cognition Arousal/Alertness: Awake/alert Behavior During Therapy: WFL for tasks assessed/performed Overall Cognitive Status: Within Functional Limits for tasks assessed                     General Comments   pt pleased he was able to go to the bathroom            Home Living Family/patient expects to be discharged to:: Private residence Living Arrangements: Spouse/significant other Available Help at Discharge: Family;Available 24 hours/day Type of Home: House Home Access: Stairs to enter [employer] of Steps: 1   Home Layout: Two level;Bed/bath upstairs Alternate Level Stairs-Number of Steps: 14 Alternate Level Stairs-Rails: Right;Left;Can reach both           Home Equipment: None          Prior Functioning/Environment Level of Independence: Independent        Comments: pt very active with yardwork, walks a mile a day without his oxygen    OT Diagnosis:  Generalized weakness   OT Problem List: Decreased strength;Decreased activity tolerance   OT Treatment/Interventions: Self-care/ADL training;DME and/or AE instruction;Patient/family education    OT Goals(Current goals can be found in the care plan section) Acute Rehab OT Goals Patient Stated Goal: get home OT Goal Formulation: With patient Time For Goal Achievement: 08/11/15 Potential to Achieve Goals: Good  OT Frequency: Min 2X/week   Barriers to D/C:               End of Session Nurse Communication: Mobility status  Activity Tolerance: Patient tolerated treatment well Patient left: in chair;with call bell/phone within reach   Time: 1131-1144 OT Time Calculation (min): 13  min Charges:  OT General Charges $OT Visit: 1 Procedure OT Evaluation $Initial OT Evaluation Tier I: 1 Procedure G-Codes:    Payton Mccallum D 08-15-15, 12:00 PM

## 2015-08-04 NOTE — Progress Notes (Signed)
Report given to [facility] RN at this time.  Pt has no c/o pain or s/s of any acute distress.

## 2015-08-04 NOTE — Evaluation (Signed)
Physical Therapy Evaluation/ Discharge Patient Details Name: Kenneth [name] MRN: [id number] DOB: Dec 11, 1935 Today's Date: 08/04/2015   History of Present Illness  79 y.o. male with a past medical history of AAA, CAD, hypertension, lung cancer (status post partial left upper lobe resection), COPD, type 2 diabetes, hyperlipidemia who is being transferred from [facility] due to acute kidney injury. The patient had been treated there for a week for bilateral pneumonia  Clinical Impression  Pt very pleasant and reports he thought he was doing himself a favor with heavy yardwork and walking a mile a day without oxygen. Pt moving well and oxygen saturation 95% on RA at rest with drop to 88% after 100', pt was able to maintain 90-93% on 1L with long hall ambulation and stairs. Pt educated for energy conservation, pursed lip breathing, walking program and activity moderation. All education completed with pt at baseline and no further therapy needs at this time. Pt encourage to continue daily gait trials with nursing.     Follow Up Recommendations No PT follow up    Equipment Recommendations  None recommended by PT    Recommendations for Other Services       Precautions / Restrictions Precautions Precautions: None      Mobility  Bed Mobility               General bed mobility comments: in chair on arrival  Transfers Overall transfer level: Modified independent                  Ambulation/Gait Ambulation/Gait assistance: Supervision Ambulation Distance (Feet): 500 Feet Assistive device: None Gait Pattern/deviations: Step-through pattern     General Gait Details: cues for breathing technique only as pt sats dropped to 88% after 100' on RA, on 1L able to maintain 90-92%  Stairs Stairs: Yes Stairs assistance: Modified independent (Device/Increase time) Stair Management: One rail Right;Alternating pattern;Forwards Number of Stairs: 8    Wheelchair  Mobility    Modified Rankin (Stroke Patients Only)       Balance Overall balance assessment: No apparent balance deficits (not formally assessed)                                           Pertinent Vitals/Pain Pain Assessment: No/denies pain  Hr 110-120 with gait    Home Living Family/patient expects to be discharged to:: Private residence Living Arrangements: Spouse/significant other Available Help at Discharge: Family;Available 24 hours/day Type of Home: House Home Access: Stairs to enter   [employer] of Steps: 1 Home Layout: Two level;Bed/bath upstairs Home Equipment: None      Prior Function Level of Independence: Independent         Comments: pt very active with yardwork, walks a mile a day without his oxygen     Hand Dominance        Extremity/Trunk Assessment   Upper Extremity Assessment: Overall WFL for tasks assessed           Lower Extremity Assessment: Overall WFL for tasks assessed      Cervical / Trunk Assessment: Normal  Communication   Communication: No difficulties  Cognition Arousal/Alertness: Awake/alert Behavior During Therapy: WFL for tasks assessed/performed Overall Cognitive Status: Within Functional Limits for tasks assessed                      General Comments  Exercises        Assessment/Plan    PT Assessment Patent does not need any further PT services  PT Diagnosis Difficulty walking   PT Problem List    PT Treatment Interventions     PT Goals (Current goals can be found in the Care Plan section) Acute Rehab PT Goals PT Goal Formulation: All assessment and education complete, DC therapy    Frequency     Barriers to discharge        Co-evaluation               End of Session   Activity Tolerance: Patient tolerated treatment well Patient left: in chair;with call bell/phone within [name] Nurse Communication: Mobility status         Time:  2025-4270 PT Time Calculation (min) (ACUTE ONLY): 18 min   Charges:   PT Evaluation $Initial PT Evaluation Tier I: 1 Procedure     PT G CodesMelford [name] 08/04/2015, 11:29 AM Kenneth [name], [place]

## 2015-08-05 LAB — GLUCOSE, CAPILLARY
GLUCOSE-CAPILLARY: 133 mg/dL — AB (ref 65–99)
GLUCOSE-CAPILLARY: 120 mg/dL — AB (ref 65–99)
GLUCOSE-CAPILLARY: 144 mg/dL — AB (ref 65–99)
Glucose-Capillary: 147 mg/dL — ABNORMAL HIGH (ref 65–99)

## 2015-08-05 LAB — BASIC METABOLIC PANEL
Anion gap: 7 (ref 5–15)
BUN: 30 mg/dL — AB (ref 6–20)
CHLORIDE: 99 mmol/L — AB (ref 101–111)
CO2: 26 mmol/L (ref 22–32)
CREATININE: 2.52 mg/dL — AB (ref 0.61–1.24)
Calcium: 8.4 mg/dL — ABNORMAL LOW (ref 8.9–10.3)
GFR calc Af Amer: 27 mL/min — ABNORMAL LOW (ref >60)
GFR calc non Af Amer: 23 mL/min — ABNORMAL LOW (ref >60)
Glucose, Bld: 145 mg/dL — ABNORMAL HIGH (ref 65–99)
Potassium: 3.7 mmol/L (ref 3.5–5.1)
SODIUM: 132 mmol/L — AB (ref 135–145)

## 2015-08-05 LAB — CBC
HCT: 30.5 % — ABNORMAL LOW (ref 39.0–52.0)
HEMOGLOBIN: 10.4 g/dL — AB (ref 13.0–17.0)
MCH: 32.5 pg (ref 26.0–34.0)
MCHC: 34.1 g/dL (ref 30.0–36.0)
MCV: 95.3 fL (ref 78.0–100.0)
Platelets: 231 10*3/uL (ref 150–400)
RBC: 3.2 MIL/uL — ABNORMAL LOW (ref 4.22–5.81)
RDW: 12.8 % (ref 11.5–15.5)
WBC: 12.8 10*3/uL — ABNORMAL HIGH (ref 4.0–10.5)

## 2015-08-05 MED ORDER — SODIUM CHLORIDE 0.45 % IV SOLN
INTRAVENOUS | Status: DC
  Administered 2015-08-05 – 2015-08-08 (×7): via INTRAVENOUS

## 2015-08-05 NOTE — Progress Notes (Signed)
[facility] PROGRESS NOTE  Kenneth [name] GNO:[id number] DOB: [birth year] DOA: 08/02/2015  PCP: [name]., MD  Brief HPI: 79 year old Caucasian male with a past medical history of AAA, currently, artery disease, hypertension, COPD, type 2 diabetes who was admitted to [facility] with pneumonia. He had been there for a week. He was on vancomycin and Zosyn. Patient had been improving and then was noted to have elevation in his creatinine. Creatinine was normal at the time of admission and then it was up to 3.72. Family requested transfer to [facility].  Past medical history:  Past Medical History  Diagnosis Date  . Abdominal aortic aneurysm without mention of rupture   . Kidney stones 05/03/09    HEMATURIA,CYSTOSCOPY DONE  . CAD (coronary artery disease), native coronary artery 03/10/2014    02/28/10 AVR with tissue valve, Cabg with LIMA to LAD, SVG to int, and SVG to RCA Dr. Roxan Hockey  Cath 02/16/2010 Calcified coronaries  LM calcified, LAD, diffuse disease, circ calcified with 60% mid, RCA calcified with 80% prox and 50% mid  36 mm Peak gradient, severe aortic stenosis  2003 Cypher stent in distal RCA , PTCA of diagonal 1996 PTCA of RCA in 2 places with restenosis treated again   . Hypertensive heart disease   . Diabetes mellitus type 2, noninsulin dependent 03/10/2014  . COPD   . Chronic dyspnea   . Chronic diastolic heart failure   . Lumbar disc disease   . Hyperlipidemia   . History of lung cancer 12/13/2005    LUL lobe Stage 1 non small cell cancer treated with resection Dr. [name]   . GERD 08/06/2008  . Atrial fibrillation   . Cancer 2007    Lung  . Hypertension   . LBBB (left bundle branch block)   . AICD (automatic cardioverter/defibrillator) present 04/13/2015    icd   . CHF (congestive heart failure)     Consultants: None  Procedures: None  Antibiotics: Previously on vancomycin and Zosyn in [facility]. He was also on  azithromycin, which he did not tolerate.  Now only on Fortaz  Subjective: Patient feels about the same. Continues to make urine. Denies any chest pain, abdominal pain, nausea, vomiting. His shortness of breath is at baseline.  Objective: Vital Signs  Filed Vitals:   08/04/15 1300 08/04/15 1436 08/04/15 2035 08/05/15 0418  BP:  128/69 136/55 118/62  Pulse: 97  102 93  Temp:  98 F (36.7 C) 99.7 F (37.6 C) 99.6 F (37.6 C)  TempSrc:  Oral Oral Oral  Resp: 21  20 18   Height:      Weight:      SpO2: 96% 93% 95% 96%    Intake/Output Summary (Last 24 hours) at 08/05/15 0951 Last data filed at 08/05/15 1694  Gross per 24 hour  Intake    630 ml  Output    875 ml  Net   -245 ml   Filed Weights   08/02/15 1845  Weight: 68.7 kg (151 lb 7.3 oz)    General appearance: alert, cooperative, appears stated age and no distress Resp: clear to auscultation bilaterally Cardio: regular rate and rhythm, S1, S2 normal, no murmur, click, rub or gallop GI: soft, non-tender; bowel sounds normal; no masses,  no organomegaly Extremities: extremities normal, atraumatic, no cyanosis or edema Neurologic: Alert and oriented 3. No focal neurological deficits are noted.  Lab Results:  Basic Metabolic Panel:  Recent Labs Lab 08/02/15 2211 08/03/15 0250 08/04/15  0259 08/05/15 0540  NA 134* 134* 136 132*  K 3.9 3.5 4.1 3.7  CL 94* 96* 101 99*  CO2 28 27 27 26   GLUCOSE 178* 184* 122* 145*  BUN 44* 42* 39* 30*  CREATININE 3.42* 3.35* 2.89* 2.52*  CALCIUM 8.9 8.8* 8.6* 8.4*  MG 1.9  --   --   --   PHOS 5.0*  --   --   --    Liver Function Tests:  Recent Labs Lab 08/02/15 2211 08/03/15 0250  AST 20 21  ALT 24 24  ALKPHOS 76 73  BILITOT 1.3* 1.3*  PROT 6.4* 6.9  ALBUMIN 2.6* 2.7*   CBC:  Recent Labs Lab 08/02/15 2211 08/03/15 0250 08/04/15 0259 08/05/15 0540  WBC 11.0* 11.0* 12.5* 12.8*  NEUTROABS 9.0*  --   --   --   HGB 12.3* 12.1* 11.6* 10.4*  HCT 34.5* 34.3* 33.8*  30.5*  MCV 94.3 94.5 96.3 95.3  PLT 188 199 220 231    CBG:  Recent Labs Lab 08/04/15 0731 08/04/15 1120 08/04/15 1658 08/04/15 2121 08/05/15 0604  GLUCAP 103* 135* 116* 147* 133*    Recent Results (from the past 240 hour(s))  MRSA PCR Screening     Status: None   Collection Time: 08/02/15  8:18 PM  Result Value Ref Range Status   MRSA by PCR NEGATIVE NEGATIVE Final    Comment:        The GeneXpert MRSA Assay (FDA approved for NASAL specimens only), is one component of a comprehensive MRSA colonization surveillance program. It is not intended to diagnose MRSA infection nor to guide or monitor treatment for MRSA infections.       Studies/Results: US Renal  08/03/2015   CLINICAL DATA:  Acute renal failure.  Diabetes and hypertension.  EXAM: RENAL / URINARY TRACT ULTRASOUND COMPLETE  COMPARISON:  08/02/2015  FINDINGS: Right Kidney:  Length: 10.3 cm. Echogenicity within normal limits. No mass or hydronephrosis visualized.  Left Kidney:  Length: 10.8 cm. Echogenicity within normal limits. No mass or hydronephrosis visualized. Stable appearance of several renal cysts, 1 measuring up to 2.3 cm in diameter and the other measuring up to 2.6 cm in diameter. These appear simple.  Bladder:  Foley catheter noted in the urinary bladder.  Other: Incidentally seen during imaging is an infrarenal abdominal aortic aneurysm measuring 5.7 cm transverse by 5.2 cm anterior-posterior (formerly 5.2 by 4.9 cm on 09/07/2014).  IMPRESSION: 1. An infrarenal abdominal aortic aneurysm has enlarged and now measures 5.7 by 5.2 cm. Vascular surgery consultation recommended due to increased risk of rupture for AAA >5.5 cm. This recommendation follows ACR consensus guidelines: White Paper of the ACR Incidental Findings Committee II on Vascular Findings. J Am Coll Radiol 2013; 10:789-794. 2. Stable appearance of several small left renal cysts. No hydronephrosis; renal echogenicity and echotexture normal.    Electronically Signed   By: Van Clines M.D.   On: 08/03/2015 15:57    Medications:  Scheduled: . cefTAZidime (FORTAZ)  IV  1 g Intravenous Q24H  . enoxaparin (LOVENOX) injection  30 mg Subcutaneous Q24H  . finasteride  5 mg Oral Daily  . insulin aspart  0-9 Units Subcutaneous TID WC  . pantoprazole  40 mg Oral Q1200   Continuous: . sodium chloride     SFK:[id number], ondansetron **OR** ondansetron (ZOFRAN) IV  Assessment/Plan:  Principal Problem:   AKI (acute kidney injury) Active Problems:   Hyperlipidemia   COPD (chronic obstructive pulmonary disease)   GERD  History of lung cancer   Abdominal aortic aneurysm   Diabetes mellitus type 2, noninsulin dependent    Acute renal failure Thought to be secondary to ATN from hypotension. He was also on nephrotoxic agents including vancomycin. He was also getting Ace inhibitors and diuretics. Blood pressure has improved. Creatinine is improving. He is making adequate amount of urine. Continue IV fluids for now. Renal ultrasound report reviewed. No hydronephrosis noted. Renal cysts were identified. He was seen by nephrologist at [facility]. Their consult note was reviewed.  Healthcare associated pneumonia Patient completed almost a week of vancomycin and Zosyn. No positive blood culture reports have been noted. Patient was started on Fortaz and azithromycin here. He did not tolerate the IV infusion of azithromycin. And so this was discontinued. Continue only [place]ressie Ellis for now. Consider discontinuing after tomorrow.  History of COPD Stable. Continue bronchodilating as needed. He is on oxygen around the clock.  History of lung cancer  He is status post surgical removal in the past.  History of abdominal aortic aneurysm Followed closely by vascular surgery. AAA findings noted on ultrasound, which was compared to ultrasound findings from last year. He was seen by Dr. Donnetta Hutching within the last 2 months. At  that time plan was for continued monitoring as outpatient. Patient denies any symptoms.  History of type 2 diabetes Continue to monitor CBGs. Continue sliding scale coverage.  History of essential hypertension  Patient's blood pressure was low at [facility]. Has improved with IV fluids. His antihypertensives are on hold.   History of ischemic cardiomyopathy with prior bypass  Ejection fraction is known to be about 35%. He also has had aortic valve replacement. He had a AICD placed in June. Be cautious with IV fluids.   DVT Prophylaxis: Lovenox     Code Status: Full code Family Communication: Discussed with the patient  Disposition Plan: Patient is improving. Continue to mobilize. Await improvement in renal function. Anticipate discharge in 1-2 days.      LOS: 3 days   [facility] Pager [phone] 08/05/2015, 9:51 AM  If 7PM-7AM, please contact night-coverage at [email or web address], password [facility]

## 2015-08-05 NOTE — Care Management Note (Signed)
Case Management Note  Patient Details  Name: Kenneth [name] MRN: [id number] Date of Birth: 1936-02-16  Subjective/Objective:      Adm w kidney injury, hypotension              Action/Plan: lives w fam, pcp dr vyas.  Pt is on home O2 continuous. CM will continue to monitor for disposition needs.    Expected Discharge Date:                  Expected Discharge Plan:  [place]  In-House Referral:     Discharge planning Services     Post Acute Care Choice:    Choice offered to:     DME Arranged:    DME Agency:     HH Arranged:    [place] Agency:     Status of Service:     Medicare Important Message Given:  Yes-third notification given Date Medicare IM Given:    Medicare IM give by:    Date Additional Medicare IM Given:    Additional Medicare Important Message give by:     If discussed at [place] of Stay Meetings, dates discussed:    Additional Comments: ur review done CM assessed pt, pt stated he does have portable oxygen tank in his car located in hospital parking with enough oxygen for transport home. Maryclare Labrador, RN 08/05/2015, 12:27 PM

## 2015-08-05 NOTE — Care Management Important Message (Signed)
Important Message  Patient Details  Name: Kenneth [name] MRN: [id number] Date of Birth: 01-28-1936   Medicare Important Message Given:  Yes-third notification given    Loann Quill 08/05/2015, 10:39 AM

## 2015-08-06 LAB — BASIC METABOLIC PANEL
ANION GAP: 8 (ref 5–15)
BUN: 27 mg/dL — ABNORMAL HIGH (ref 6–20)
CHLORIDE: 101 mmol/L (ref 101–111)
CO2: 24 mmol/L (ref 22–32)
Calcium: 8.4 mg/dL — ABNORMAL LOW (ref 8.9–10.3)
Creatinine, Ser: 1.96 mg/dL — ABNORMAL HIGH (ref 0.61–1.24)
GFR calc non Af Amer: 31 mL/min — ABNORMAL LOW (ref >60)
GFR, EST AFRICAN AMERICAN: 36 mL/min — AB (ref >60)
GLUCOSE: 129 mg/dL — AB (ref 65–99)
POTASSIUM: 3.8 mmol/L (ref 3.5–5.1)
Sodium: 133 mmol/L — ABNORMAL LOW (ref 135–145)

## 2015-08-06 LAB — GLUCOSE, CAPILLARY
GLUCOSE-CAPILLARY: 122 mg/dL — AB (ref 65–99)
GLUCOSE-CAPILLARY: 132 mg/dL — AB (ref 65–99)
GLUCOSE-CAPILLARY: 139 mg/dL — AB (ref 65–99)
Glucose-Capillary: 128 mg/dL — ABNORMAL HIGH (ref 65–99)

## 2015-08-06 LAB — CBC
HEMATOCRIT: 29.9 % — AB (ref 39.0–52.0)
HEMOGLOBIN: 10.4 g/dL — AB (ref 13.0–17.0)
MCH: 33.3 pg (ref 26.0–34.0)
MCHC: 34.8 g/dL (ref 30.0–36.0)
MCV: 95.8 fL (ref 78.0–100.0)
Platelets: 238 10*3/uL (ref 150–400)
RBC: 3.12 MIL/uL — ABNORMAL LOW (ref 4.22–5.81)
RDW: 12.9 % (ref 11.5–15.5)
WBC: 10.9 10*3/uL — AB (ref 4.0–10.5)

## 2015-08-06 MED ORDER — IPRATROPIUM-ALBUTEROL 0.5-2.5 (3) MG/3ML IN SOLN
3.0000 mL | Freq: Four times a day (QID) | RESPIRATORY_TRACT | Status: DC
Start: 2015-08-06 — End: 2015-08-10
  Administered 2015-08-06 – 2015-08-10 (×15): 3 mL via RESPIRATORY_TRACT
  Filled 2015-08-06 (×14): qty 3

## 2015-08-06 NOTE — Progress Notes (Signed)
[facility] PROGRESS NOTE  CANNEN DUPRAS DSK:[id number] DOB: 03/25/1936 DOA: 08/02/2015  PCP: [name]., MD  Brief HPI: 79 year old Caucasian male with a past medical history of AAA, currently, artery disease, hypertension, COPD, type 2 diabetes who was admitted to [facility] with pneumonia. He had been there for a week. He was on vancomycin and Zosyn. Patient had been improving and then was noted to have elevation in his creatinine. Creatinine was normal at the time of admission and then it was up to 3.72. Family requested transfer to [facility].  Past medical history:  Past Medical History  Diagnosis Date  . Abdominal aortic aneurysm without mention of rupture   . Kidney stones 05/03/09    HEMATURIA,CYSTOSCOPY DONE  . CAD (coronary artery disease), native coronary artery 03/10/2014    02/28/10 AVR with tissue valve, Cabg with LIMA to LAD, SVG to int, and SVG to RCA Dr. Roxan Hockey  Cath 02/16/2010 Calcified coronaries  LM calcified, LAD, diffuse disease, circ calcified with 60% mid, RCA calcified with 80% prox and 50% mid  36 mm Peak gradient, severe aortic stenosis  2003 Cypher stent in distal RCA , PTCA of diagonal 1996 PTCA of RCA in 2 places with restenosis treated again   . Hypertensive heart disease   . Diabetes mellitus type 2, noninsulin dependent 03/10/2014  . COPD   . Chronic dyspnea   . Chronic diastolic heart failure   . Lumbar disc disease   . Hyperlipidemia   . History of lung cancer 12/13/2005    LUL lobe Stage 1 non small cell cancer treated with resection Dr. [name]   . GERD 08/06/2008  . Atrial fibrillation   . Cancer 2007    Lung  . Hypertension   . LBBB (left bundle branch block)   . AICD (automatic cardioverter/defibrillator) present 04/13/2015    icd   . CHF (congestive heart failure)     Consultants: None  Procedures: None  Antibiotics: Previously on vancomycin and Zosyn in [facility]. He was also on  azithromycin, which he did not tolerate.  Now only on Fortaz.  Subjective: Patient feels well. Denies any shortness of breath or cough. Continues to make urine.   Objective: Vital Signs  Filed Vitals:   08/05/15 0418 08/05/15 1304 08/05/15 1936 08/06/15 0337  BP: 118/62 125/104 131/71 115/47  Pulse: 93 102 91 91  Temp: 99.6 F (37.6 C) 98.4 F (36.9 C) 99.3 F (37.4 C) 99.5 F (37.5 C)  TempSrc: Oral Oral Oral Oral  Resp: 18 18 19 17   Height:      Weight:      SpO2: 96% 98% 98% 97%    Intake/Output Summary (Last 24 hours) at 08/06/15 1100 Last data filed at 08/06/15 0900  Gross per 24 hour  Intake   1260 ml  Output    425 ml  Net    835 ml   Filed Weights   08/02/15 1845  Weight: 68.7 kg (151 lb 7.3 oz)    General appearance: alert, cooperative, appears stated age and no distress Resp: clear to auscultation bilaterally Cardio: regular rate and rhythm, S1, S2 normal, no murmur, click, rub or gallop GI: soft, non-tender; bowel sounds normal; no masses,  no organomegaly Neurologic: Alert and oriented 3. No focal neurological deficits are noted.  Lab Results:  Basic Metabolic Panel:  Recent Labs Lab 08/02/15 2211 08/03/15 0250 08/04/15 0259 08/05/15 0540 08/06/15 0323  NA 134* 134* 136 132* 133*  K  3.9 3.5 4.1 3.7 3.8  CL 94* 96* 101 99* 101  CO2 28 27 27 26 24   GLUCOSE 178* 184* 122* 145* 129*  BUN 44* 42* 39* 30* 27*  CREATININE 3.42* 3.35* 2.89* 2.52* 1.96*  CALCIUM 8.9 8.8* 8.6* 8.4* 8.4*  MG 1.9  --   --   --   --   PHOS 5.0*  --   --   --   --    Liver Function Tests:  Recent Labs Lab 08/02/15 2211 08/03/15 0250  AST 20 21  ALT 24 24  ALKPHOS 76 73  BILITOT 1.3* 1.3*  PROT 6.4* 6.9  ALBUMIN 2.6* 2.7*   CBC:  Recent Labs Lab 08/02/15 2211 08/03/15 0250 08/04/15 0259 08/05/15 0540 08/06/15 0323  WBC 11.0* 11.0* 12.5* 12.8* 10.9*  NEUTROABS 9.0*  --   --   --   --   HGB 12.3* 12.1* 11.6* 10.4* 10.4*  HCT 34.5* 34.3* 33.8*  30.5* 29.9*  MCV 94.3 94.5 96.3 95.3 95.8  PLT 188 199 220 231 238    CBG:  Recent Labs Lab 08/05/15 0604 08/05/15 1114 08/05/15 1625 08/05/15 2117 08/06/15 0622  GLUCAP 133* 147* 120* 144* 128*    Recent Results (from the past 240 hour(s))  MRSA PCR Screening     Status: None   Collection Time: 08/02/15  8:18 PM  Result Value Ref Range Status   MRSA by PCR NEGATIVE NEGATIVE Final    Comment:        The GeneXpert MRSA Assay (FDA approved for NASAL specimens only), is one component of a comprehensive MRSA colonization surveillance program. It is not intended to diagnose MRSA infection nor to guide or monitor treatment for MRSA infections.       Studies/Results: No results found.  Medications:  Scheduled: . cefTAZidime (FORTAZ)  IV  1 g Intravenous Q24H  . enoxaparin (LOVENOX) injection  30 mg Subcutaneous Q24H  . finasteride  5 mg Oral Daily  . insulin aspart  0-9 Units Subcutaneous TID WC  . pantoprazole  40 mg Oral Q1200   Continuous: . sodium chloride 75 mL/hr at 08/06/15 0030   GNO:[id number], ondansetron **OR** ondansetron (ZOFRAN) IV  Assessment/Plan:  Principal Problem:   AKI (acute kidney injury) Active Problems:   Hyperlipidemia   COPD (chronic obstructive pulmonary disease)   GERD   History of lung cancer   Abdominal aortic aneurysm   Diabetes mellitus type 2, noninsulin dependent    Acute renal failure Thought to be secondary to ATN from hypotension. He was also on nephrotoxic agents including vancomycin. He was also getting Ace inhibitors and diuretics. Blood pressure has improved. Creatinine is improving. He is making adequate amount of urine. Continue IV fluids for now. Renal ultrasound report reviewed. No hydronephrosis noted. Renal cysts were identified. He was seen by nephrologist at [facility]. Their consult note was reviewed.  Healthcare associated pneumonia Patient completed almost a week of vancomycin  and Zosyn. No positive blood culture reports have been noted. Patient was started on Fortaz and azithromycin here. He did not tolerate the IV infusion of azithromycin. And so this was discontinued. Only on [place] for now. Will not need antibiotics at the time of discharge.  History of COPD Stable. Continue bronchodilating as needed. He is on oxygen around the clock.  History of lung cancer  He is status post surgical removal in the past. Currently stable. No evidence for recurrence.  History of abdominal aortic aneurysm Followed closely by vascular  surgery. AAA findings noted on ultrasound, which was compared to ultrasound findings from last year. He was seen by Dr. Donnetta Hutching within the last 2 months and was found to have stable AAA. At that time plan was for continued monitoring as outpatient. Patient denies any symptoms.  History of type 2 diabetes Continue to monitor CBGs. Continue sliding scale coverage.  History of essential hypertension  Patient's blood pressure was low at [facility]. Has improved with IV fluids. His antihypertensives are on hold.   History of ischemic cardiomyopathy with prior bypass  Ejection fraction is known to be about 35%. He also has had aortic valve replacement. He had a AICD placed in June. Be cautious with IV fluids.   DVT Prophylaxis: Lovenox     Code Status: Full code Family Communication: Discussed with the patient  Disposition Plan: Patient continues to improve. Anticipate discharge tomorrow.      LOS: 4 days   [place] [facility] Pager [phone] 08/06/2015, 11:00 AM  If 7PM-7AM, please contact night-coverage at [email or web address], password [facility]

## 2015-08-07 ENCOUNTER — Encounter (HOSPITAL_COMMUNITY): Payer: Self-pay | Admitting: Radiology

## 2015-08-07 ENCOUNTER — Inpatient Hospital Stay (HOSPITAL_COMMUNITY): Payer: Medicare HMO

## 2015-08-07 LAB — BASIC METABOLIC PANEL
ANION GAP: 9 (ref 5–15)
BUN: 17 mg/dL (ref 6–20)
CALCIUM: 8.7 mg/dL — AB (ref 8.9–10.3)
CHLORIDE: 99 mmol/L — AB (ref 101–111)
CO2: 25 mmol/L (ref 22–32)
Creatinine, Ser: 1.92 mg/dL — ABNORMAL HIGH (ref 0.61–1.24)
GFR calc non Af Amer: 32 mL/min — ABNORMAL LOW (ref >60)
GFR, EST AFRICAN AMERICAN: 37 mL/min — AB (ref >60)
GLUCOSE: 132 mg/dL — AB (ref 65–99)
Potassium: 3.8 mmol/L (ref 3.5–5.1)
Sodium: 133 mmol/L — ABNORMAL LOW (ref 135–145)

## 2015-08-07 LAB — CBC
HCT: 29.1 % — ABNORMAL LOW (ref 39.0–52.0)
Hemoglobin: 10.5 g/dL — ABNORMAL LOW (ref 13.0–17.0)
MCH: 33.8 pg (ref 26.0–34.0)
MCHC: 36.1 g/dL — ABNORMAL HIGH (ref 30.0–36.0)
MCV: 93.6 fL (ref 78.0–100.0)
PLATELETS: 284 10*3/uL (ref 150–400)
RBC: 3.11 MIL/uL — AB (ref 4.22–5.81)
RDW: 12.9 % (ref 11.5–15.5)
WBC: 13 10*3/uL — AB (ref 4.0–10.5)

## 2015-08-07 LAB — GLUCOSE, CAPILLARY
GLUCOSE-CAPILLARY: 152 mg/dL — AB (ref 65–99)
GLUCOSE-CAPILLARY: 122 mg/dL — AB (ref 65–99)
Glucose-Capillary: 130 mg/dL — ABNORMAL HIGH (ref 65–99)
Glucose-Capillary: 107 mg/dL — ABNORMAL HIGH (ref 65–99)

## 2015-08-07 MED ORDER — DEXTROSE 5 % IV SOLN
1.0000 g | Freq: Two times a day (BID) | INTRAVENOUS | Status: DC
Start: 2015-08-07 — End: 2015-08-10
  Administered 2015-08-07 – 2015-08-10 (×6): 1 g via INTRAVENOUS
  Filled 2015-08-07 (×7): qty 1

## 2015-08-07 MED ORDER — DOXYCYCLINE HYCLATE 100 MG PO TABS
100.0000 mg | ORAL_TABLET | Freq: Two times a day (BID) | ORAL | Status: DC
  Administered 2015-08-07 – 2015-08-11 (×9): 100 mg via ORAL
  Filled 2015-08-07 (×9): qty 1

## 2015-08-07 NOTE — Progress Notes (Signed)
Pt states he has been more SOB today that he has been. Pt with clear/diminished BS bilaterally. Pt states he takes 40 mg Lasix at home per day but has not been getting it here during hospitalization. Pt only takes HHN Prn at home.

## 2015-08-07 NOTE — Progress Notes (Signed)
[facility] PROGRESS NOTE  Kenneth [name] WNU:[id number] DOB: 05-27-1936 DOA: 08/02/2015  PCP: [name]., MD  Brief HPI: 79 year old Caucasian male with a past medical history of AAA, currently, artery disease, hypertension, COPD, type 2 diabetes who was admitted to [facility] with pneumonia. He had been there for a week. He was on vancomycin and Zosyn. Patient had been improving and then was noted to have elevation in his creatinine. Creatinine was normal at the time of admission and then it was up to 3.72. Family requested transfer to [facility].  Past medical history:  Past Medical History  Diagnosis Date  . Abdominal aortic aneurysm without mention of rupture   . Kidney stones 05/03/09    HEMATURIA,CYSTOSCOPY DONE  . CAD (coronary artery disease), native coronary artery 03/10/2014    02/28/10 AVR with tissue valve, Cabg with LIMA to LAD, SVG to int, and SVG to RCA Dr. Roxan Hockey  Cath 02/16/2010 Calcified coronaries  LM calcified, LAD, diffuse disease, circ calcified with 60% mid, RCA calcified with 80% prox and 50% mid  36 mm Peak gradient, severe aortic stenosis  2003 Cypher stent in distal RCA , PTCA of diagonal 1996 PTCA of RCA in 2 places with restenosis treated again   . Hypertensive heart disease   . Diabetes mellitus type 2, noninsulin dependent 03/10/2014  . COPD   . Chronic dyspnea   . Chronic diastolic heart failure   . Lumbar disc disease   . Hyperlipidemia   . History of lung cancer 12/13/2005    LUL lobe Stage 1 non small cell cancer treated with resection Dr. [name]   . GERD 08/06/2008  . Atrial fibrillation   . Cancer 2007    Lung  . Hypertension   . LBBB (left bundle branch block)   . AICD (automatic cardioverter/defibrillator) present 04/13/2015    icd   . CHF (congestive heart failure)     Consultants: None  Procedures: None  Antibiotics: Previously on vancomycin and Zosyn in [facility]. He was also on  azithromycin, which he did not tolerate.  Now only on Fortaz. Started on doxycycline 9/25  Subjective: Patient feels worse today. He states that he is coughing more and feeling more short of breath. He had sharp pain in his left chest yesterday, but this was transient. He also had low-grade fevers overnight. Denies any vomiting.   Objective: Vital Signs  Filed Vitals:   08/06/15 2027 08/06/15 2029 08/07/15 0147 08/07/15 0435  BP:    101/54  Pulse:    114  Temp: 99.1 F (37.3 C)   99.7 F (37.6 C)  TempSrc: Oral   Oral  Resp:    18  Height:      Weight:      SpO2:  96% 96% 91%    Intake/Output Summary (Last 24 hours) at 08/07/15 1127 Last data filed at 08/07/15 0909  Gross per 24 hour  Intake  852.5 ml  Output    700 ml  Net  152.5 ml   Filed Weights   08/02/15 1845  Weight: 68.7 kg (151 lb 7.3 oz)    General appearance: alert, cooperative, appears stated age and no distress Resp: clear to auscultation bilaterally Cardio: regular rate and rhythm, S1, S2 normal, no murmur, click, rub or gallop GI: soft, non-tender; bowel sounds normal; no masses,  no organomegaly Neurologic: Alert and oriented 3. No focal neurological deficits are noted.  Lab Results:  Basic Metabolic Panel:  Recent Labs  Lab 08/02/15 2211 08/03/15 0250 08/04/15 0259 08/05/15 0540 08/06/15 0323 08/07/15 0710  NA 134* 134* 136 132* 133* 133*  K 3.9 3.5 4.1 3.7 3.8 3.8  CL 94* 96* 101 99* 101 99*  CO2 28 27 27 26 24 25   GLUCOSE 178* 184* 122* 145* 129* 132*  BUN 44* 42* 39* 30* 27* 17  CREATININE 3.42* 3.35* 2.89* 2.52* 1.96* 1.92*  CALCIUM 8.9 8.8* 8.6* 8.4* 8.4* 8.7*  MG 1.9  --   --   --   --   --   PHOS 5.0*  --   --   --   --   --    Liver Function Tests:  Recent Labs Lab 08/02/15 2211 08/03/15 0250  AST 20 21  ALT 24 24  ALKPHOS 76 73  BILITOT 1.3* 1.3*  PROT 6.4* 6.9  ALBUMIN 2.6* 2.7*   CBC:  Recent Labs Lab 08/02/15 2211 08/03/15 0250 08/04/15 0259  08/05/15 0540 08/06/15 0323  WBC 11.0* 11.0* 12.5* 12.8* 10.9*  NEUTROABS 9.0*  --   --   --   --   HGB 12.3* 12.1* 11.6* 10.4* 10.4*  HCT 34.5* 34.3* 33.8* 30.5* 29.9*  MCV 94.3 94.5 96.3 95.3 95.8  PLT 188 199 220 231 238    CBG:  Recent Labs Lab 08/06/15 0622 08/06/15 1116 08/06/15 1620 08/06/15 2102 08/07/15 0609  GLUCAP 128* 122* 132* 139* 130*    Recent Results (from the past 240 hour(s))  MRSA PCR Screening     Status: None   Collection Time: 08/02/15  8:18 PM  Result Value Ref Range Status   MRSA by PCR NEGATIVE NEGATIVE Final    Comment:        The GeneXpert MRSA Assay (FDA approved for NASAL specimens only), is one component of a comprehensive MRSA colonization surveillance program. It is not intended to diagnose MRSA infection nor to guide or monitor treatment for MRSA infections.       Studies/Results: No results found.  Medications:  Scheduled: . cefTAZidime (FORTAZ)  IV  1 g Intravenous Q24H  . enoxaparin (LOVENOX) injection  30 mg Subcutaneous Q24H  . finasteride  5 mg Oral Daily  . ipratropium-albuterol  3 mL Nebulization Q6H  . pantoprazole  40 mg Oral Q1200   Continuous: . sodium chloride 50 mL/hr at 08/07/15 0612   OQH:[id number] **OR** ondansetron (ZOFRAN) IV  Assessment/Plan:  Principal Problem:   AKI (acute kidney injury) Active Problems:   Hyperlipidemia   COPD (chronic obstructive pulmonary disease)   GERD   History of lung cancer   Abdominal aortic aneurysm   Diabetes mellitus type 2, noninsulin dependent    Acute renal failure Thought to be secondary to ATN from hypotension. He was also on nephrotoxic agents including vancomycin. He was also getting Ace inhibitors and diuretics. Blood pressure has improved. Creatinine is improving. He is making adequate amount of urine. Continue IV fluids for now. Renal ultrasound report reviewed. No hydronephrosis noted. Renal cysts were identified. He was seen by nephrologist at  [facility]. Their consult note was reviewed.  Healthcare associated pneumonia with worsening dyspnea Patient with worsening dyspnea and low-grade fevers overnight. He apparently is coughing more. Will check CBC. We'll also order a CT scan of the chest without contrast. Patient completed almost a week of vancomycin and Zosyn. No positive blood culture reports have been noted. Patient was started on Fortaz and azithromycin here. He did not tolerate the IV infusion of azithromycin. And so this  was discontinued. Only on [place] for now. We will add doxycycline.  History of COPD Stable. Continue bronchodilating as needed. He is on oxygen around the clock.  History of lung cancer  He is status post surgical removal in the past. Currently stable. No evidence for recurrence.  History of abdominal aortic aneurysm Followed closely by vascular surgery. AAA findings noted on ultrasound, which was compared to ultrasound findings from last year. He was seen by Dr. Donnetta Hutching within the last 2 months and was found to have stable AAA. At that time plan was for continued monitoring as outpatient. Patient denies any symptoms. Abdomen is benign.  Elevated blood sugars Patient denies any history of diabetes. He states that his blood sugars tend to go high due to prednisone. He doesn't take any medications for diabetes at home. We will discontinue sliding scale coverage. Obtain HbA1c.  History of essential hypertension  Patient's blood pressure was low at [facility]. Has improved with IV fluids. His antihypertensives are on hold.   History of ischemic cardiomyopathy with prior bypass  Ejection fraction is known to be about 35%. He also has had aortic valve replacement. He had a AICD placed in June. Be cautious with IV fluids.   DVT Prophylaxis: Lovenox     Code Status: Full code Family Communication: Discussed with the patient and his wife Disposition Plan: Patient has had a setback. We will  need further investigations. Hold off on discharge.      LOS: 5 days   [facility] Pager [phone] 08/07/2015, 11:27 AM  If 7PM-7AM, please contact night-coverage at [email or web address], password [facility]

## 2015-08-08 ENCOUNTER — Encounter (HOSPITAL_COMMUNITY): Payer: Self-pay | Admitting: Adult Health

## 2015-08-08 DIAGNOSIS — J439 Emphysema, unspecified: Secondary | ICD-10-CM

## 2015-08-08 DIAGNOSIS — J849 Interstitial pulmonary disease, unspecified: Secondary | ICD-10-CM

## 2015-08-08 LAB — CBC
HCT: 27.7 % — ABNORMAL LOW (ref 39.0–52.0)
Hemoglobin: 9.7 g/dL — ABNORMAL LOW (ref 13.0–17.0)
MCH: 33 pg (ref 26.0–34.0)
MCHC: 35 g/dL (ref 30.0–36.0)
MCV: 94.2 fL (ref 78.0–100.0)
PLATELETS: 290 10*3/uL (ref 150–400)
RBC: 2.94 MIL/uL — ABNORMAL LOW (ref 4.22–5.81)
RDW: 12.8 % (ref 11.5–15.5)
WBC: 9.7 10*3/uL (ref 4.0–10.5)

## 2015-08-08 LAB — GLUCOSE, CAPILLARY
GLUCOSE-CAPILLARY: 120 mg/dL — AB (ref 65–99)
GLUCOSE-CAPILLARY: 166 mg/dL — AB (ref 65–99)
GLUCOSE-CAPILLARY: 295 mg/dL — AB (ref 65–99)
Glucose-Capillary: 124 mg/dL — ABNORMAL HIGH (ref 65–99)
Glucose-Capillary: 253 mg/dL — ABNORMAL HIGH (ref 65–99)

## 2015-08-08 LAB — BASIC METABOLIC PANEL
ANION GAP: 9 (ref 5–15)
BUN: 12 mg/dL (ref 6–20)
CALCIUM: 8.7 mg/dL — AB (ref 8.9–10.3)
CO2: 21 mmol/L — ABNORMAL LOW (ref 22–32)
CREATININE: 1.76 mg/dL — AB (ref 0.61–1.24)
Chloride: 106 mmol/L (ref 101–111)
GFR, EST AFRICAN AMERICAN: 42 mL/min — AB (ref >60)
GFR, EST NON AFRICAN AMERICAN: 36 mL/min — AB (ref >60)
Glucose, Bld: 124 mg/dL — ABNORMAL HIGH (ref 65–99)
Potassium: 3.8 mmol/L (ref 3.5–5.1)
SODIUM: 136 mmol/L (ref 135–145)

## 2015-08-08 LAB — SEDIMENTATION RATE

## 2015-08-08 MED ORDER — INSULIN ASPART 100 UNIT/ML ~~LOC~~ SOLN: 5.0000 [IU] | Freq: Once | SUBCUTANEOUS | Status: DC

## 2015-08-08 MED ORDER — INSULIN ASPART 100 UNIT/ML ~~LOC~~ SOLN
0.0000 [IU] | Freq: Three times a day (TID) | SUBCUTANEOUS | Status: DC
  Administered 2015-08-09: 5 [IU] via SUBCUTANEOUS

## 2015-08-08 MED ORDER — METHYLPREDNISOLONE SODIUM SUCC 125 MG IJ SOLR
60.0000 mg | Freq: Two times a day (BID) | INTRAMUSCULAR | Status: DC
  Administered 2015-08-08 – 2015-08-09 (×4): 60 mg via INTRAVENOUS
  Filled 2015-08-08 (×4): qty 2

## 2015-08-08 MED ORDER — INSULIN ASPART 100 UNIT/ML ~~LOC~~ SOLN: 0.0000 [IU] | Freq: Every day | SUBCUTANEOUS | Status: DC

## 2015-08-08 MED ORDER — INSULIN ASPART 100 UNIT/ML ~~LOC~~ SOLN
0.0000 [IU] | Freq: Every day | SUBCUTANEOUS | Status: DC
  Administered 2015-08-09: 5 [IU] via SUBCUTANEOUS

## 2015-08-08 NOTE — Progress Notes (Signed)
[facility] PROGRESS NOTE  Kenneth [name] JJH:[id number] DOB: December 24, 1935 DOA: 08/02/2015  PCP: [name]., MD  Brief HPI: 79 year old Caucasian male with a past medical history of AAA, currently, artery disease, hypertension, COPD, type 2 diabetes who was admitted to [facility] with pneumonia. He had been there for a week. He was on vancomycin and Zosyn. Patient had been improving and then was noted to have elevation in his creatinine. Creatinine was normal at the time of admission and then it was up to 3.72. Family requested transfer to [facility].  Past medical history:  Past Medical History  Diagnosis Date  . Abdominal aortic aneurysm without mention of rupture   . Kidney stones 05/03/09    HEMATURIA,CYSTOSCOPY DONE  . CAD (coronary artery disease), native coronary artery 03/10/2014    02/28/10 AVR with tissue valve, Cabg with LIMA to LAD, SVG to int, and SVG to RCA Dr. Roxan Hockey  Cath 02/16/2010 Calcified coronaries  LM calcified, LAD, diffuse disease, circ calcified with 60% mid, RCA calcified with 80% prox and 50% mid  36 mm Peak gradient, severe aortic stenosis  2003 Cypher stent in distal RCA , PTCA of diagonal 1996 PTCA of RCA in 2 places with restenosis treated again   . Hypertensive heart disease   . Diabetes mellitus type 2, noninsulin dependent 03/10/2014  . COPD   . Chronic dyspnea   . Chronic diastolic heart failure   . Lumbar disc disease   . Hyperlipidemia   . History of lung cancer 12/13/2005    LUL lobe Stage 1 non small cell cancer treated with resection Dr. [name]   . GERD 08/06/2008  . Atrial fibrillation   . Cancer 2007    Lung  . Hypertension   . LBBB (left bundle branch block)   . AICD (automatic cardioverter/defibrillator) present 04/13/2015    icd   . CHF (congestive heart failure)     Consultants: None  Procedures: None  Antibiotics: Previously on vancomycin and Zosyn in [facility]. He was also on  azithromycin, which he did not tolerate.  He was started on Fortaz 9/20 Started on doxycycline 9/25  Subjective: Patient does not feel any better. Continues to be short of breath with minimal exertion. Continues to have low-grade fevers overnight. No nausea or vomiting. Has a cough with yellowish sputum.  Objective: Vital Signs  Filed Vitals:   08/07/15 2008 08/08/15 0224 08/08/15 0408 08/08/15 0858  BP:   138/71   Pulse:   112 110  Temp:   99.5 F (37.5 C)   TempSrc:   Oral   Resp:   18 18  Height:      Weight:      SpO2: 92% 92% 94% 94%    Intake/Output Summary (Last 24 hours) at 08/08/15 1053 Last data filed at 08/08/15 1043  Gross per 24 hour  Intake    360 ml  Output   1525 ml  Net  -1165 ml   Filed Weights   08/02/15 1845  Weight: 68.7 kg (151 lb 7.3 oz)    General appearance: alert, cooperative, appears stated age and no distress Resp: clear to auscultation bilaterally Cardio: regular rate and rhythm, S1, S2 normal, no murmur, click, rub or gallop GI: soft, non-tender; bowel sounds normal; no masses,  no organomegaly Neurologic: Alert and oriented 3. No focal neurological deficits are noted.  Lab Results:  Basic Metabolic Panel:  Recent Labs Lab 08/02/15 2211  08/04/15 0259 08/05/15 0540 08/06/15  [id number] 08/07/15 0710 08/08/15 0400  NA 134*  < > 136 132* 133* 133* 136  K 3.9  < > 4.1 3.7 3.8 3.8 3.8  CL 94*  < > 101 99* 101 99* 106  CO2 28  < > 27 26 24 25  21*  GLUCOSE 178*  < > 122* 145* 129* 132* 124*  BUN 44*  < > 39* 30* 27* 17 12  CREATININE 3.42*  < > 2.89* 2.52* 1.96* 1.92* 1.76*  CALCIUM 8.9  < > 8.6* 8.4* 8.4* 8.7* 8.7*  MG 1.9  --   --   --   --   --   --   PHOS 5.0*  --   --   --   --   --   --   < > = values in this interval not displayed. Liver Function Tests:  Recent Labs Lab 08/02/15 2211 08/03/15 0250  AST 20 21  ALT 24 24  ALKPHOS 76 73  BILITOT 1.3* 1.3*  PROT 6.4* 6.9  ALBUMIN 2.6* 2.7*   CBC:  Recent Labs Lab  08/02/15 2211  08/04/15 0259 08/05/15 0540 08/06/15 0323 08/07/15 1407 08/08/15 0400  WBC 11.0*  < > 12.5* 12.8* 10.9* 13.0* 9.7  NEUTROABS 9.0*  --   --   --   --   --   --   HGB 12.3*  < > 11.6* 10.4* 10.4* 10.5* 9.7*  HCT 34.5*  < > 33.8* 30.5* 29.9* 29.1* 27.7*  MCV 94.3  < > 96.3 95.3 95.8 93.6 94.2  PLT 188  < > 220 231 238 284 290  < > = values in this interval not displayed.  CBG:  Recent Labs Lab 08/07/15 0609 08/07/15 1156 08/07/15 1632 08/07/15 2108 08/08/15 0613  GLUCAP 130* 152* 107* 122* 124*    Recent Results (from the past 240 hour(s))  MRSA PCR Screening     Status: None   Collection Time: 08/02/15  8:18 PM  Result Value Ref Range Status   MRSA by PCR NEGATIVE NEGATIVE Final    Comment:        The GeneXpert MRSA Assay (FDA approved for NASAL specimens only), is one component of a comprehensive MRSA colonization surveillance program. It is not intended to diagnose MRSA infection nor to guide or monitor treatment for MRSA infections.       Studies/Results: Ct Chest Wo Contrast  08/07/2015   CLINICAL DATA:  Cough, fever, shortness of breath. History of lung cancer.  EXAM: CT CHEST WITHOUT CONTRAST  TECHNIQUE: Multidetector CT imaging of the chest was performed following the standard protocol without IV contrast.  COMPARISON:  Chest CT 07/26/2015  FINDINGS: Progressive right upper lobe interstitial thickening and airspace disease as well as left upper lobe and lower lobe opacities. Findings most compatible with pneumonia. No pleural effusions. Underlying COPD.  Heart is normal size. Aorta is normal caliber. Prior CABG. Pacer in stable position. Small scattered mediastinal lymph nodes, none pathologically enlarged. No hilar or axillary adenopathy.  Chest wall soft tissues are unremarkable. Imaging into the upper abdomen shows no acute findings.  IMPRESSION: Progressive opacities in the right upper lobe, left upper lobe and left lower lobe concerning for  worsening multifocal pneumonia.  Mild COPD.   Electronically Signed   By: Rolm Baptise M.D.   On: 08/07/2015 15:33    Medications:  Scheduled: . cefTAZidime (FORTAZ)  IV  1 g Intravenous Q12H  . doxycycline  100 mg Oral Q12H  . enoxaparin (LOVENOX)  injection  30 mg Subcutaneous Q24H  . finasteride  5 mg Oral Daily  . ipratropium-albuterol  3 mL Nebulization Q6H  . pantoprazole  40 mg Oral Q1200   Continuous: . sodium chloride 100 mL/hr at 08/08/15 0076   AUQ:[id number] **OR** ondansetron (ZOFRAN) IV  Assessment/Plan:  Principal Problem:   AKI (acute kidney injury) Active Problems:   Hyperlipidemia   COPD (chronic obstructive pulmonary disease)   GERD   History of lung cancer   Abdominal aortic aneurysm   Diabetes mellitus type 2, noninsulin dependent   Healthcare associated pneumonia with worsening dyspnea and bilateral infiltrates Patient with worsening dyspnea and low-grade fevers. CT scan of the chest shows left-sided infiltrates. These are progressive compared to previous CT done about 2 weeks ago. Doxycycline was added yesterday evening. Patient does not feel any worse nor any better. Considering persistence of these infiltrates and his dyspnea despite almost 2 weeks of IV antibiotics, we will go ahead and consult pulmonology. Patient completed almost a week of vancomycin and Zosyn at [facility]. No positive blood culture reports have been noted. Patient was started on Fortaz and azithromycin here. He did not tolerate the IV infusion of azithromycin. And so this was discontinued. Now on Fortaz and doxycycline.  Acute renal failure Thought to be secondary to ATN from hypotension. He was also on nephrotoxic agents including vancomycin. He was also getting Ace inhibitors and diuretics. Blood pressure has improved. Creatinine is improving. He is making adequate amount of urine. Continue IV fluids for now, but at a lower rate. Renal ultrasound report reviewed. No hydronephrosis  noted. Renal cysts were identified. He was seen by nephrologist at [facility]. Their consult note was reviewed.  Elevated blood sugars Patient denies any history of diabetes. He states that his blood sugars tend to go high due to prednisone. He doesn't take any medications for diabetes at home. Sliding scale coverage was discontinued per patient request. HbA1c is pending. Pulmonology considering adding steroids. This may worsen his glycemic control. We may need to reinitiate sliding scale coverage  History of COPD Stable. Continue bronchodilating as needed. He is on oxygen around the clock.  History of lung cancer  He is status post surgical removal in the past. Currently stable. No evidence for recurrence.  History of abdominal aortic aneurysm Followed closely by vascular surgery. AAA findings noted on ultrasound, which was compared to ultrasound findings from last year. He was seen by Dr. Donnetta Hutching within the last 2 months and was found to have stable AAA. At that time plan was for continued monitoring as outpatient. Patient denies any symptoms. Abdomen is benign.  History of essential hypertension  Patient's blood pressure was low at [facility]. Has improved with IV fluids. His antihypertensives are on hold.   History of ischemic cardiomyopathy with prior bypass  Ejection fraction is known to be about 35%. He also has had aortic valve replacement. He had a AICD placed in June.   DVT Prophylaxis: Lovenox     Code Status: Full code Family Communication: Discussed with the patient and his wife Disposition Plan: Await pulmonology input.      LOS: 6 days   [facility] Pager [phone] 08/08/2015, 10:53 AM  If 7PM-7AM, please contact night-coverage at [email or web address], password [facility]

## 2015-08-08 NOTE — Progress Notes (Signed)
Occupational Therapy Treatment Patient Details Name: GUY SEESE MRN: [id number] DOB: [birth year] Today's Date: 08/08/2015    History of present illness 79 y.o. male with a past medical history of AAA, CAD, hypertension, lung cancer (status post partial left upper lobe resection), COPD, type 2 diabetes, hyperlipidemia who is being transferred from [facility] due to acute kidney injury. The patient had been treated there for a week for bilateral pneumonia   OT comments  Pt progressing. HR increased to 130s in session and O2 dropping in 80s on 2L (see vital section below). Will continue to follow acutely.  Follow Up Recommendations  No OT follow up    Equipment Recommendations  None recommended by OT    Recommendations for Other Services      Precautions / Restrictions Precautions Precaution Comments: watch HR and O2 sats Restrictions Weight Bearing Restrictions: No       Mobility Bed Mobility Overal bed mobility: Needs Assistance Bed Mobility: Supine to Sit;Sit to Supine     Supine to sit: Supervision Sit to supine: Supervision      Transfers Overall transfer level: Needs assistance     Sit to Stand: Supervision              Balance    No LOB in session.                                ADL Overall ADL's : Needs assistance/impaired     Grooming: Wash/dry face;Standing;Supervision/safety (had pt go retrieve all items except shaving cream/razor-decided to do later) Grooming Details (indicate cue type and reason): wife assisted in washing shaving cream off his face later when he was sitting; pt applied shaving cream to face standing-supervision     Lower Body Bathing: Sit to/from stand Lower Body Bathing Details (indicate cue type and reason): pt washed bottom at supervision/setup level and wife dried it   Upper Body Dressing Details (indicate cue type and reason): wife/OT assisted with gown/wires Lower Body Dressing:  Supervision/safety;Sitting/lateral leans (donned socks; OT retrieved socks, but pt could have done )   Toilet Transfer: Supervision/safety (sit to stand from bed)           Functional mobility during ADLs: Supervision/safety General ADL Comments: Educated on energy conservation techniques and reinforced deep breathing technique. Plan is for pt to sponge bathe at home until shower gets finished remodeling.      Vision                     Perception     Praxis      Cognition  Awake/Alert Behavior During Therapy: WFL for tasks assessed/performed Overall Cognitive Status: Within Functional Limits for tasks assessed                       Extremity/Trunk Assessment               Exercises     Shoulder Instructions       General Comments      Pertinent Vitals/ Pain       Pain Assessment: 0-10 Pain Score: 2  Pain Location: back Pain Descriptors / Indicators: Aching Pain Intervention(s): Monitored during session   Pt on 2L of O2 at beginning of session and O2 dropping to 80s in session. Bumped pt up to 2.5 and then 3L of O2. Left pt on 3L-towards end of session, pt's O2  was 90% on 3L.  Home Living                                          Prior Functioning/Environment              Frequency Min 2X/week     Progress Toward Goals  OT Goals(current goals can now be found in the care plan section)  Progress towards OT goals: Progressing toward goals  Acute Rehab OT Goals Patient Stated Goal: not stated OT Goal Formulation: With patient Time For Goal Achievement: 08/11/15 Potential to Achieve Goals: Good ADL Goals Pt Will Perform Grooming: with modified independence;standing Pt Will Transfer to Toilet: with modified independence;regular height toilet;ambulating Pt Will Perform Toileting - Clothing Manipulation and hygiene: with modified independence;sit to/from stand Pt Will Perform Tub/Shower Transfer: with modified  independence;ambulating  Plan Discharge plan remains appropriate    Co-evaluation                 End of Session Equipment Utilized During Treatment: Oxygen   Activity Tolerance Other (comment) (increased HR)   Patient Left in bed;with call bell/phone within reach;with family/visitor present   Nurse Communication Mobility status;Other (comment) (HR/O2 sats/left on 3L of O2)        Time: 0931-1000 OT Time Calculation (min): 29 min  Charges: OT General Charges $OT Visit: 1 Procedure OT Treatments $Self Care/Home Management : 8-22 mins $Therapeutic Activity: 8-22 mins   Benito Mccreedy OTR/L [phone] 08/08/2015, 10:56 AM

## 2015-08-08 NOTE — Consult Note (Signed)
Name: Kenneth [name] MRN: [id number] DOB: 1936/08/11    ADMISSION DATE:  08/02/2015 CONSULTATION DATE:  9/26  REFERRING MD :  [place] Pink   CHIEF COMPLAINT:  Pulmonary infiltrates   BRIEF PATIENT DESCRIPTION: 79yo male with hx CAD s/p CABG and AVR 2011, HTN, COPD, hx [place] lung cancer s/p LULobectomy 2007, DM originally admitted to [facility] hospital with PNA.  Treated with abx but had no improvement, Vancomycin stopped r/t AKI and pt was tx to [facility] 9/20 for further treatment.  Despite 2 weeks abx he still shows no significant improvement symptomatically or radiographically with bilar R>L pulmonary infiltrates and PCCM consulted.   SIGNIFICANT EVENTS    STUDIES:  CT chest 9/25>>> Progressive opacities in the right upper lobe, left upper lobe and left lower lobe concerning for worsening multifocal pneumonia.  Mild COPD.   HISTORY OF PRESENT ILLNESS:  79yo male with hx CAD, HTN, COPD, hx [place] lung cancer s/p LULobectomy 2007, DM originally admitted to [facility] with PNA.  Treated with abx but had no improvement, Vancomycin stopped r/t AKI and pt was tx to [facility] 9/20 for further treatment.  Despite 2 weeks abx he still shows no significant improvement symptomatically or radiographically with bilar R>L pulmonary infiltrates and PCCM consulted.   Per pt was in his usual state of health (on home O2 but does not wear it continuously, can walk 1 mile per day without O2) until approx 2 weeks ago when he had sudden onset headache, SOB, malaise, nonproductive cough.  Denies chest pain, hemoptysis, purulent sputum, fevers, chills.  Still has significant DOE and is very easily fatigued with minimal activity.   No known occupational or environmental exposure.  Pets - 2 dogs. Quit smoking 2007.    PAST MEDICAL HISTORY :   has a past medical history of Abdominal aortic aneurysm without mention of rupture; Kidney stones (05/03/09); CAD (coronary artery disease), native coronary artery  (03/10/2014); Hypertensive heart disease; Diabetes mellitus type 2, noninsulin dependent (03/10/2014); COPD; Chronic dyspnea; Chronic diastolic heart failure; Lumbar disc disease; Hyperlipidemia; History of lung cancer (12/13/2005); GERD (08/06/2008); Atrial fibrillation; Cancer (2007); Hypertension; LBBB (left bundle branch block); AICD (automatic cardioverter/defibrillator) present (04/13/2015); and CHF (congestive heart failure).  has past surgical history that includes Coronary artery bypass graft (02/2010); Tissue aortic valve replacement (02/2010); Tonsillectomy; LUL RESECTION (12/2005); Colonoscopy (2008); Cardiac catheterization (N/A, 04/13/2015); and Cataract extraction (Right). Prior to Admission medications   Medication Sig Start Date End Date Taking? Authorizing Provider  albuterol (PROVENTIL) (2.5 MG/3ML) 0.083% nebulizer solution Take 2.5 mg by nebulization every 6 (six) hours as needed for wheezing or shortness of breath.  08/03/13  Yes Historical Provider, MD  aspirin 81 MG tablet Take 81 mg by mouth daily.    Yes Historical Provider, MD  furosemide (LASIX) 40 MG tablet Take 40 mg by mouth daily.  02/21/15  Yes Historical Provider, MD  omeprazole (PRILOSEC) 40 MG capsule Take 40 mg by mouth daily.  12/10/14  Yes Historical Provider, MD  potassium chloride (K-DUR,KLOR-CON) 10 MEQ tablet Take 10 mEq by mouth every other day.  02/14/15  Yes Historical Provider, MD  valsartan-hydrochlorothiazide (DIOVAN-HCT) 320-25 MG per tablet Take 0.5 tablets by mouth as needed (blood pressure).    Yes Historical Provider, MD  nebivolol (BYSTOLIC) 5 MG tablet Take 5 mg by mouth daily.    Historical Provider, MD   Allergies  Allergen Reactions  . Ciprocin-Fluocin-Procin [Fluocinolone] Nausea Only  . Clarithromycin Nausea Only    Altered taste  .  Hydrocodone-Acetaminophen Nausea Only    FAMILY HISTORY:  family history includes Cancer in his mother; Dementia in his mother; Diabetes in his daughter and father; Heart  attack in his brother and father; Heart disease in his brother and father; Hyperlipidemia in his brother and father; Hypertension in his brother and father; Peripheral vascular disease in his brother. SOCIAL HISTORY:  reports that he quit smoking about 9 years ago. His smoking use included Cigarettes. He has a 50 pack-year smoking history. He has never used smokeless tobacco. He reports that he does not drink alcohol or use illicit drugs.  REVIEW OF SYSTEMS:   As per HPI - All other systems reviewed and were neg.    SUBJECTIVE:   VITAL SIGNS: Temp:  [98.1 F (36.7 C)-99.5 F (37.5 C)] 99.5 F (37.5 C) (09/26 0408) Pulse Rate:  [76-112] 110 (09/26 0858) Resp:  [17-18] 18 (09/26 0858) BP: (124-164)/(57-76) 138/71 mmHg (09/26 0408) SpO2:  [92 %-98 %] 94 % (09/26 0858)  PHYSICAL EXAMINATION: General:  Pleasant, chronically ill appearing male, NAD in bed  Neuro:  Awake, alert, appropriate, MAE  HEENT:  Mm dry, no JVD Cardiovascular:  s1s2 rrr, bounding pulse  Lungs:  resps even non labored on 3L [place], mostly clear, few very fine crackles  Abdomen:  Round, soft, non tender, +bs  Musculoskeletal:  Warm and dry, no edema     Recent Labs Lab 08/06/15 0323 08/07/15 0710 08/08/15 0400  NA 133* 133* 136  K 3.8 3.8 3.8  CL 101 99* 106  CO2 24 25 21*  BUN 27* 17 12  CREATININE 1.96* 1.92* 1.76*  GLUCOSE 129* 132* 124*    Recent Labs Lab 08/06/15 0323 08/07/15 1407 08/08/15 0400  HGB 10.4* 10.5* 9.7*  HCT 29.9* 29.1* 27.7*  WBC 10.9* 13.0* 9.7  PLT 238 284 290   Ct Chest Wo Contrast  08/07/2015   CLINICAL DATA:  Cough, fever, shortness of breath. History of lung cancer.  EXAM: CT CHEST WITHOUT CONTRAST  TECHNIQUE: Multidetector CT imaging of the chest was performed following the standard protocol without IV contrast.  COMPARISON:  Chest CT 07/26/2015  FINDINGS: Progressive right upper lobe interstitial thickening and airspace disease as well as left upper lobe and lower lobe  opacities. Findings most compatible with pneumonia. No pleural effusions. Underlying COPD.  Heart is normal size. Aorta is normal caliber. Prior CABG. Pacer in stable position. Small scattered mediastinal lymph nodes, none pathologically enlarged. No hilar or axillary adenopathy.  Chest wall soft tissues are unremarkable. Imaging into the upper abdomen shows no acute findings.  IMPRESSION: Progressive opacities in the right upper lobe, left upper lobe and left lower lobe concerning for worsening multifocal pneumonia.  Mild COPD.   Electronically Signed   By: Rolm Baptise M.D.   On: 08/07/2015 15:33    ASSESSMENT / PLAN:  Persistent bilateral pulmonary infiltrates - evidence of ILD R>L dating back to 2014.  S/p LULobectomy for [place] lung ca in 2007.  Worsening.  Seems more likely inflammatory.    Acute on chronic respiratory failure  COPD   REC -  Sputum culture if able  Supplemental O2 as needed  Sed rate, rheumatoid, ANA Consider swallow eval  Pulmonary hygiene  IV steroids  Plan for FOB in am 9/27 NPO after midnight     Nickolas Madrid, NP 08/08/2015  11:28 AM Pager: [phone] or [phone]

## 2015-08-08 NOTE — Care Management Important Message (Signed)
Important Message  Patient Details  Name: Kenneth [name] MRN: [id number] Date of Birth: Jun 30, 1936   Medicare Important Message Given:  Yes-fourth notification given    Delorse Lek 08/08/2015, 2:09 PM

## 2015-08-09 ENCOUNTER — Inpatient Hospital Stay (HOSPITAL_COMMUNITY): Payer: Medicare HMO

## 2015-08-09 ENCOUNTER — Encounter (HOSPITAL_COMMUNITY)
Admission: AD | Disposition: A | Discharge status: Home / Self Care | Payer: Self-pay | Source: Other Acute Inpatient Hospital | Attending: Internal Medicine

## 2015-08-09 DIAGNOSIS — J849 Interstitial pulmonary disease, unspecified: Secondary | ICD-10-CM | POA: Insufficient documentation

## 2015-08-09 DIAGNOSIS — R918 Other nonspecific abnormal finding of lung field: Secondary | ICD-10-CM | POA: Insufficient documentation

## 2015-08-09 HISTORY — PX: VIDEO BRONCHOSCOPY: SHX5072

## 2015-08-09 LAB — DIFFERENTIAL
Basophils Absolute: 0 10*3/uL (ref 0.0–0.1)
Basophils Relative: 0 %
EOS PCT: 0 %
Eosinophils Absolute: 0 10*3/uL (ref 0.0–0.7)
LYMPHS PCT: 4 %
Lymphs Abs: 0.3 10*3/uL — ABNORMAL LOW (ref 0.7–4.0)
MONO ABS: 0.1 10*3/uL (ref 0.1–1.0)
MONOS PCT: 1 %
NEUTROS ABS: 7 10*3/uL (ref 1.7–7.7)
Neutrophils Relative %: 95 %

## 2015-08-09 LAB — GLUCOSE, CAPILLARY
GLUCOSE-CAPILLARY: 257 mg/dL — AB (ref 65–99)
GLUCOSE-CAPILLARY: 173 mg/dL — AB (ref 65–99)
Glucose-Capillary: 223 mg/dL — ABNORMAL HIGH (ref 65–99)
Glucose-Capillary: 176 mg/dL — ABNORMAL HIGH (ref 65–99)

## 2015-08-09 LAB — CBC
HEMATOCRIT: 30.1 % — AB (ref 39.0–52.0)
Hemoglobin: 10.6 g/dL — ABNORMAL LOW (ref 13.0–17.0)
MCH: 33.1 pg (ref 26.0–34.0)
MCHC: 35.2 g/dL (ref 30.0–36.0)
MCV: 94.1 fL (ref 78.0–100.0)
PLATELETS: 320 10*3/uL (ref 150–400)
RBC: 3.2 MIL/uL — ABNORMAL LOW (ref 4.22–5.81)
RDW: 12.6 % (ref 11.5–15.5)
WBC: 7.4 10*3/uL (ref 4.0–10.5)

## 2015-08-09 LAB — BASIC METABOLIC PANEL
Anion gap: 11 (ref 5–15)
BUN: 20 mg/dL (ref 6–20)
CALCIUM: 8.8 mg/dL — AB (ref 8.9–10.3)
CO2: 23 mmol/L (ref 22–32)
CREATININE: 1.71 mg/dL — AB (ref 0.61–1.24)
Chloride: 101 mmol/L (ref 101–111)
GFR, EST AFRICAN AMERICAN: 43 mL/min — AB (ref >60)
GFR, EST NON AFRICAN AMERICAN: 37 mL/min — AB (ref >60)
GLUCOSE: 221 mg/dL — AB (ref 65–99)
Potassium: 3.8 mmol/L (ref 3.5–5.1)
Sodium: 135 mmol/L (ref 135–145)

## 2015-08-09 LAB — HEMOGLOBIN A1C
Hgb A1c MFr Bld: 7.4 % — ABNORMAL HIGH (ref 4.8–5.6)
Mean Plasma Glucose: 166 mg/dL

## 2015-08-09 LAB — ANTINUCLEAR ANTIBODIES, IFA: ANA Ab, IFA: NEGATIVE

## 2015-08-09 LAB — RHEUMATOID FACTOR: Rhuematoid fact SerPl-aCnc: 526 IU/mL — ABNORMAL HIGH (ref 0.0–13.9)

## 2015-08-09 SURGERY — BRONCHOSCOPY, WITH FLUOROSCOPY
Anesthesia: Moderate Sedation | Laterality: Bilateral

## 2015-08-09 MED ORDER — INSULIN GLARGINE 100 UNIT/ML ~~LOC~~ SOLN
10.0000 [IU] | Freq: Every day | SUBCUTANEOUS | Status: DC
  Administered 2015-08-09 – 2015-08-11 (×3): 10 [IU] via SUBCUTANEOUS
  Filled 2015-08-09 (×3): qty 0.1

## 2015-08-09 MED ORDER — PHENYLEPHRINE HCL 0.25 % NA SOLN
NASAL | Status: DC | PRN
  Administered 2015-08-09: 2 via NASAL

## 2015-08-09 MED ORDER — FENTANYL CITRATE (PF) 100 MCG/2ML IJ SOLN
25.0000 ug | INTRAMUSCULAR | Status: DC | PRN
  Administered 2015-08-09 (×3): 25 ug via INTRAVENOUS

## 2015-08-09 MED ORDER — PHENYLEPHRINE HCL 0.25 % NA SOLN
1.0000 | Freq: Four times a day (QID) | NASAL | Status: DC | PRN
  Filled 2015-08-09: qty 15

## 2015-08-09 MED ORDER — SODIUM CHLORIDE 0.9 % IV SOLN
INTRAVENOUS | Status: DC
  Administered 2015-08-09 – 2015-08-10 (×3): via INTRAVENOUS

## 2015-08-09 MED ORDER — LIDOCAINE HCL 2 % EX GEL
CUTANEOUS | Status: DC | PRN
  Administered 2015-08-09: 1

## 2015-08-09 MED ORDER — INSULIN ASPART 100 UNIT/ML ~~LOC~~ SOLN: 0.0000 [IU] | Freq: Every day | SUBCUTANEOUS | Status: DC

## 2015-08-09 MED ORDER — INSULIN GLARGINE 100 UNIT/ML ~~LOC~~ SOLN: 10.0000 [IU] | Freq: Every day | SUBCUTANEOUS | Status: DC

## 2015-08-09 MED ORDER — LIDOCAINE HCL 2 % EX GEL
1.0000 "application " | Freq: Once | CUTANEOUS | Status: DC
  Filled 2015-08-09: qty 5

## 2015-08-09 MED ORDER — LIDOCAINE HCL (PF) 1 % IJ SOLN
INTRAMUSCULAR | Status: DC | PRN
  Administered 2015-08-09: 6 mL

## 2015-08-09 MED ORDER — LIVING WELL WITH DIABETES BOOK
Freq: Once | Status: AC
  Administered 2015-08-09: 20:00:00
  Filled 2015-08-09: qty 1

## 2015-08-09 MED ORDER — MIDAZOLAM HCL 5 MG/ML IJ SOLN
INTRAMUSCULAR | Status: AC
  Filled 2015-08-09: qty 2

## 2015-08-09 MED ORDER — MIDAZOLAM HCL 10 MG/2ML IJ SOLN
INTRAMUSCULAR | Status: DC | PRN
  Administered 2015-08-09 (×2): 1 mg via INTRAVENOUS

## 2015-08-09 MED ORDER — FENTANYL CITRATE (PF) 100 MCG/2ML IJ SOLN
INTRAMUSCULAR | Status: AC
  Filled 2015-08-09: qty 4

## 2015-08-09 MED ORDER — BUTAMBEN-TETRACAINE-BENZOCAINE 2-2-14 % EX AERO
1.0000 | INHALATION_SPRAY | Freq: Once | CUTANEOUS | Status: DC
  Filled 2015-08-09: qty 20

## 2015-08-09 MED ORDER — INSULIN ASPART 100 UNIT/ML ~~LOC~~ SOLN
0.0000 [IU] | Freq: Three times a day (TID) | SUBCUTANEOUS | Status: DC
  Administered 2015-08-09: 11 [IU] via SUBCUTANEOUS
  Administered 2015-08-10 (×3): 7 [IU] via SUBCUTANEOUS
  Administered 2015-08-11: 4 [IU] via SUBCUTANEOUS

## 2015-08-09 NOTE — Progress Notes (Signed)
Video bronchoscopy performed.  Intervention bronchial biopsy. Intervention bronchial washing.  Patient tolerated procedure well.  Will continue to monitor. 

## 2015-08-09 NOTE — Progress Notes (Signed)
Inpatient Diabetes Program Recommendations  AACE/ADA: New Consensus Statement on Inpatient Glycemic Control (2015)  Target Ranges:  Prepandial:   less than 140 mg/dL      Peak postprandial:   less than 180 mg/dL (1-2 hours)      Critically ill patients:  140 - 180 mg/dL    Results for Kenneth [name], Kenneth [name] (MRN [id number]) as of 08/09/2015 11:04  Ref. Range 08/08/2015 04:00  Hemoglobin A1C Latest Ref Range: 4.8-5.6 % 7.4 (H)    Results for Kenneth [name], Kenneth [name] (MRN [id number]) as of 08/09/2015 11:04  Ref. Range 08/08/2015 06:13 08/08/2015 11:46 08/08/2015 16:18 08/08/2015 22:03 08/08/2015 23:56  Glucose-Capillary Latest Ref Range: 65-99 mg/dL 124 (H) 120 (H) 166 (H) 295 (H) 253 (H)     Admit with: Pneumonia/ Acute Renal Failure/ Transfer from [facility]  History: Lung Cancer, COPD, Patient denies prior history of DM  Current Insulin Orders: Lantus 10 units daily AM      Novolog Moderate SSI (0-15 units) TID       Novolog Custom bedtime scale (0-8 units) at bedtime only    -Note Lantus and Novolog started today.  -A1c results 7.4%.  No prior History of DM noted in H&P.    MD- Is this a new diagnosis of DM?  Per ADA Standards of Care, A1c of 6.5% or greater is a positive diagnosis of DM.  Please address with patient.  If this is a new diagnosis, patient will need to begin basic DM survival skills education with bedside nursing staff.   MD- Please also change current bedtime SSI scale to the true bedtime scale per the Glycemic Control order set.  Current custom bedtime SSI is very aggressive.     Will follow Kenneth Quaker RN, MSN, CDE Diabetes Coordinator Inpatient Glycemic Control Team Team Pager: [phone] (8a-5p)

## 2015-08-09 NOTE — Care Management Note (Addendum)
Case Management Note  Patient Details  Name: Kenneth [name] MRN: [id number] Date of Birth: 17-Apr-1936  Subjective/Objective:      Adm w kidney injury, hypotension              Action/Plan: lives w fam, pcp dr vyas.  Pt is on home O2 continuous. CM will continue to monitor for disposition needs.    Expected Discharge Date:                  Expected Discharge Plan:  [place]  In-House Referral:     Discharge planning Services     Post Acute Care Choice:    Choice offered to:     DME Arranged:    DME Agency:     HH Arranged:    [place] Agency:     Status of Service:     Medicare Important Message Given:  Yes-fourth notification given Date Medicare IM Given:    Medicare IM give by:    Date Additional Medicare IM Given:    Additional Medicare Important Message give by:     If discussed at [place] of Stay Meetings, dates discussed:  08/09/15  Additional Comments: ur review done 08/09/15 CM contacted by [name] at [place], pt will use agency upon discharge for home O2.  [name] requested ambulating note prior to discharge.  CM requested order from MD.  CM will continue to follow for disposition needs  08/05/15: CM assessed pt, pt stated he does have portable oxygen tank in his car located in hospital parking with enough oxygen for transport home. Maryclare Labrador, RN 08/09/2015, 11:47 AM

## 2015-08-09 NOTE — Op Note (Signed)
Indication : Bilateral acute on chronic unexplained  infiltrates in this ex smoker. Written informed consent was obtained prior to the procedure. The risks of the procedure including coughing, bleeding and the small chance of lung puncture requiring chest tube were discussed in great detail. The benefits & alternatives including serial follow up were also discussed.  2 mg versed & 75 mcg fentnayl used in divided doses during the procedure Bronchoscope entered from the left nare. Upper airway nml Vocal cords showed nml appearance & motion. Trachea & bronchial tree examined to the subsegmental level. Left upper lobectomy stump . Mild amount of purulent secretions were noted in RUL & LLL. No endobronchial lesions seen. Trans bronchial biopsies x 4 were obtained from the LLL under fluoroscopy. BAL was also obtained from the LLL.  There was moderate coughing  during the procedure.  A CXR will be performed to r/o presence of pneumothorax.  ALVA,RAKESH V.  [phone]

## 2015-08-09 NOTE — Progress Notes (Signed)
[facility] PROGRESS NOTE  Kenneth [name] ZJI:[id number] DOB: [birth year] DOA: 08/02/2015  PCP: [name]., MD  Brief HPI: 79 year old Caucasian male with a past medical history of AAA, currently, artery disease, hypertension, COPD, type 2 diabetes who was admitted to [facility] with pneumonia. He had been there for a week. He was on vancomycin and Zosyn. Patient had been improving and then was noted to have elevation in his creatinine. Creatinine was normal at the time of admission and then it was up to 3.72. Family requested transfer to [facility]. Patient was started on IV fluids. He was started on Fortaz. His renal function started improving. However, 2 days ago he developed worsening shortness of breath. CT scan of the chest showed bilateral pulmonary infiltrates. Pulmonology was consulted. Bronchoscopy was performed on 9/27.  Past medical history:  Past Medical History  Diagnosis Date  . Abdominal aortic aneurysm without mention of rupture   . Kidney stones 05/03/09    HEMATURIA,CYSTOSCOPY DONE  . CAD (coronary artery disease), native coronary artery 03/10/2014    02/28/10 AVR with tissue valve, Cabg with LIMA to LAD, SVG to int, and SVG to RCA Dr. Roxan Hockey  Cath 02/16/2010 Calcified coronaries  LM calcified, LAD, diffuse disease, circ calcified with 60% mid, RCA calcified with 80% prox and 50% mid  36 mm Peak gradient, severe aortic stenosis  2003 Cypher stent in distal RCA , PTCA of diagonal 1996 PTCA of RCA in 2 places with restenosis treated again   . Hypertensive heart disease   . Diabetes mellitus type 2, noninsulin dependent 03/10/2014  . COPD   . Chronic dyspnea   . Chronic diastolic heart failure   . Lumbar disc disease   . Hyperlipidemia   . History of lung cancer 12/13/2005    LUL lobe Stage 1 non small cell cancer treated with resection Dr. [name]   . GERD 08/06/2008  . Atrial fibrillation   . Cancer 2007    Lung  . Hypertension   .  LBBB (left bundle branch block)   . AICD (automatic cardioverter/defibrillator) present 04/13/2015    icd   . CHF (congestive heart failure)     Consultants: None  Procedures:   Bronchoscopy 9/27   Antibiotics: Previously on vancomycin and Zosyn in [facility]. He was also on azithromycin, which he did not tolerate.  He was started on Fortaz 9/20 Started on doxycycline 9/25  Subjective: Patient feels better this morning. Denies any problems during the procedure. Denies any chest pain. Breathing is improved. Cough is better.  Objective: Vital Signs  Filed Vitals:   08/09/15 0900 08/09/15 0905 08/09/15 0910 08/09/15 0915  BP:  119/78 114/77 119/82  Pulse: 96 94 95 96  Temp:      TempSrc:      Resp: $Remo'18 11 10 18  'xaGEO$ Height:      Weight:      SpO2: 92% 96% 94% 92%    Intake/Output Summary (Last 24 hours) at 08/09/15 1000 Last data filed at 08/09/15 0745  Gross per 24 hour  Intake 1531.67 ml  Output   1075 ml  Net 456.67 ml   Filed Weights   08/02/15 1845 08/09/15 0604 08/09/15 0715  Weight: 68.7 kg (151 lb 7.3 oz) 69.4 kg (153 lb) 69.4 kg (153 lb)    General appearance: alert, cooperative, appears stated age and no distress Resp: improved air entry bilaterally. No wheezing or crackles. Cardio: regular rate and rhythm, S1, S2 normal, no  murmur, click, rub or gallop GI: soft, non-tender; bowel sounds normal; no masses,  no organomegaly Neurologic: Alert and oriented 3. No focal neurological deficits are noted.  Lab Results:  Basic Metabolic Panel:  Recent Labs Lab 08/02/15 2211  08/05/15 0540 08/06/15 0323 08/07/15 0710 08/08/15 0400 08/09/15 0411  NA 134*  < > 132* 133* 133* 136 135  K 3.9  < > 3.7 3.8 3.8 3.8 3.8  CL 94*  < > 99* 101 99* 106 101  CO2 28  < > $R'26 24 25 'Yd$ 21* 23  GLUCOSE 178*  < > 145* 129* 132* 124* 221*  BUN 44*  < > 30* 27* $Remov'17 12 20  'UvvBnR$ CREATININE 3.42*  < > 2.52* 1.96* 1.92* 1.76* 1.71*  CALCIUM 8.9  < > 8.4* 8.4* 8.7* 8.7*  8.8*  MG 1.9  --   --   --   --   --   --   PHOS 5.0*  --   --   --   --   --   --   < > = values in this interval not displayed. Liver Function Tests:  Recent Labs Lab 08/02/15 2211 08/03/15 0250  AST 20 21  ALT 24 24  ALKPHOS 76 73  BILITOT 1.3* 1.3*  PROT 6.4* 6.9  ALBUMIN 2.6* 2.7*   CBC:  Recent Labs Lab 08/02/15 2211  08/05/15 0540 08/06/15 0323 08/07/15 1407 08/08/15 0400 08/09/15 0411  WBC 11.0*  < > 12.8* 10.9* 13.0* 9.7 7.4  NEUTROABS 9.0*  --   --   --   --   --  7.0  HGB 12.3*  < > 10.4* 10.4* 10.5* 9.7* 10.6*  HCT 34.5*  < > 30.5* 29.9* 29.1* 27.7* 30.1*  MCV 94.3  < > 95.3 95.8 93.6 94.2 94.1  PLT 188  < > 231 238 284 290 320  < > = values in this interval not displayed.  CBG:  Recent Labs Lab 08/08/15 1146 08/08/15 1618 08/08/15 2203 08/08/15 2356 08/09/15 0602  GLUCAP 120* 166* 295* 253* 223*    Recent Results (from the past 240 hour(s))  MRSA PCR Screening     Status: None   Collection Time: 08/02/15  8:18 PM  Result Value Ref Range Status   MRSA by PCR NEGATIVE NEGATIVE Final    Comment:        The GeneXpert MRSA Assay (FDA approved for NASAL specimens only), is one component of a comprehensive MRSA colonization surveillance program. It is not intended to diagnose MRSA infection nor to guide or monitor treatment for MRSA infections.   Culture, blood (routine x 2)     Status: None (Preliminary result)   Collection Time: 08/07/15  1:55 PM  Result Value Ref Range Status   Specimen Description BLOOD LEFT HAND  Final   Special Requests BOTTLES DRAWN AEROBIC AND ANAEROBIC 5CC  Final   Culture NO GROWTH < 24 HOURS  Final   Report Status PENDING  Incomplete  Culture, blood (routine x 2)     Status: None (Preliminary result)   Collection Time: 08/07/15  2:07 PM  Result Value Ref Range Status   Specimen Description BLOOD LEFT ANTECUBITAL  Final   Special Requests IN PEDIATRIC BOTTLE 2CC  Final   Culture NO GROWTH < 24 HOURS  Final    Report Status PENDING  Incomplete      Studies/Results: Ct Chest Wo Contrast  08/07/2015   CLINICAL DATA:  Cough, fever, shortness of breath. History of  lung cancer.  EXAM: CT CHEST WITHOUT CONTRAST  TECHNIQUE: Multidetector CT imaging of the chest was performed following the standard protocol without IV contrast.  COMPARISON:  Chest CT 07/26/2015  FINDINGS: Progressive right upper lobe interstitial thickening and airspace disease as well as left upper lobe and lower lobe opacities. Findings most compatible with pneumonia. No pleural effusions. Underlying COPD.  Heart is normal size. Aorta is normal caliber. Prior CABG. Pacer in stable position. Small scattered mediastinal lymph nodes, none pathologically enlarged. No hilar or axillary adenopathy.  Chest wall soft tissues are unremarkable. Imaging into the upper abdomen shows no acute findings.  IMPRESSION: Progressive opacities in the right upper lobe, left upper lobe and left lower lobe concerning for worsening multifocal pneumonia.  Mild COPD.   Electronically Signed   By: Rolm Baptise M.D.   On: 08/07/2015 15:33   Dg Chest Port 1 View  08/09/2015   CLINICAL DATA:  Status post bronchoscopy with biopsy  EXAM: PORTABLE CHEST - 1 VIEW  COMPARISON:  08/07/2015  FINDINGS: Cardiac shadow is stable. A defibrillator is again noted. Patchy airspace changes are again identified in both lungs similar to that seen on prior exam. No pneumothorax is noted following recent bronchoscopy. No bony abnormality is seen.  IMPRESSION: Stable appearance of the chest without acute abnormality.   Electronically Signed   By: Inez Catalina M.D.   On: 08/09/2015 09:07   Dg C-arm Bronchoscopy  08/09/2015   CLINICAL DATA:    C-ARM BRONCHOSCOPY  Fluoroscopy was utilized by the requesting physician.  No radiographic  interpretation.     Medications:  Scheduled: . butamben-tetracaine-benzocaine  1 spray Topical Once  . cefTAZidime (FORTAZ)  IV  1 g Intravenous Q12H  .  doxycycline  100 mg Oral Q12H  . enoxaparin (LOVENOX) injection  30 mg Subcutaneous Q24H  . finasteride  5 mg Oral Daily  . insulin aspart  0-15 Units Subcutaneous TID WC  . insulin aspart  0-8 Units Subcutaneous QHS  . insulin glargine  10 Units Subcutaneous Daily  . ipratropium-albuterol  3 mL Nebulization Q6H  . lidocaine  1 application Topical Once  . methylPREDNISolone (SOLU-MEDROL) injection  60 mg Intravenous Q12H  . pantoprazole  40 mg Oral Q1200   Continuous: . sodium chloride 50 mL/hr at 08/08/15 2125  . sodium chloride Stopped (08/09/15 0856)   WNI:OEVOJJKK (SUBLIMAZE) injection, ondansetron **OR** ondansetron (ZOFRAN) IV, phenylephrine  Assessment/Plan:  Principal Problem:   AKI (acute kidney injury) Active Problems:   Hyperlipidemia   COPD (chronic obstructive pulmonary disease)   GERD   History of lung cancer   Abdominal aortic aneurysm   Diabetes mellitus type 2, noninsulin dependent   ILD (interstitial lung disease)   Healthcare associated pneumonia with worsening dyspnea and bilateral infiltrates Patient is feeling better. Underwent bronchoscopy this morning. Cultures have been sent. Transbronchial biopsies were obtained. Patient was started on steroids per pulmonology. He seems to be better. This could be an inflammatory process over and above an infectious process. Patient's ESR is greater than 140. Rheumatoid factor 526. Eosinophils count is normal. CT scan of the chest done on 9/25 showed left-sided infiltrates. These are progressive compared to previous CT done about 2 weeks ago. Doxycycline was added 9/25. Patient completed almost a week of vancomycin and Zosyn at [facility]. No positive blood culture reports have been noted. Patient was started on Fortaz and azithromycin here. He did not tolerate the IV infusion of azithromycin. And so this was discontinued. Now on [place] and  doxycycline.  Acute renal failure Thought to be secondary to ATN from hypotension. He  was also on nephrotoxic agents including vancomycin. He was also getting Ace inhibitors and diuretics. Blood pressure has improved. Creatinine is improving. He is making adequate amount of urine. Continue IV fluids for now, but at a lower rate. Renal ultrasound report reviewed. No hydronephrosis noted. Renal cysts were identified. He was seen by nephrologist at [facility]. Their consult note was reviewed.  Elevated blood sugars/steroid-induced diabetes Patient denies any history of diabetes. He states that his blood sugars tend to go high due to prednisone. He doesn't take any medications for diabetes at home. HbA1c is 7.3. Blood sugars are once again elevated due to initiate steroids. Start Lantus. Continue sliding scale coverage.   History of COPD Stable. Continue bronchodilators as needed. He is on oxygen around the clock.  History of lung cancer  He is status post surgical removal in the past. Currently stable. No evidence for recurrence.  History of abdominal aortic aneurysm Followed closely by vascular surgery. AAA findings noted on ultrasound, which was compared to ultrasound findings from last year. He was seen by Dr. Donnetta Hutching within the last 2 months and was found to have stable AAA. At that time plan was for continued monitoring as outpatient. Patient denies any symptoms. Abdomen is benign.  History of essential hypertension  Patient's blood pressure was low at [facility]. Has improved with IV fluids. His antihypertensives are on hold.   History of ischemic cardiomyopathy with prior bypass  Ejection fraction is known to be about 35%. He also has had aortic valve replacement. He had a AICD placed in June.   DVT Prophylaxis: Lovenox     Code Status: Full code Family Communication: Discussed with the patient and his wife Disposition Plan: Await improvement. Await culture data from BAL.     LOS: 7 days   [place] [facility] Pager  [phone] 08/09/2015, 10:00 AM  If 7PM-7AM, please contact night-coverage at [email or web address], password [facility]

## 2015-08-09 NOTE — Consult Note (Signed)
Name: Kenneth [name] MRN: [id number] DOB: February 22, 1936    ADMISSION DATE:  08/02/2015 CONSULTATION DATE:  9/26  REFERRING MD :  Maryland Pink   CHIEF COMPLAINT:  Pulmonary infiltrates   BRIEF PATIENT DESCRIPTION: 79yo male with hx CAD s/p CABG and AVR 2011, HTN, COPD, hx [place] lung cancer s/p LULobectomy 2007, DM originally admitted to [facility] hospital with PNA.  Treated with abx but had no improvement, Vancomycin stopped r/t AKI and pt was tx to [facility] 9/20 for further treatment.  Despite 2 weeks abx he still shows no significant improvement symptomatically or radiographically with bilar R>L pulmonary infiltrates and PCCM consulted.   No known occupational or environmental exposure.  Pets - 2 dogs. Quit smoking 2007.  H/o LUlobectomy (burney) 2007. Review shows chronic infx dating back to 2014  SIGNIFICANT EVENTS    STUDIES:  CT chest 9/25>>> Progressive opacities in the right upper lobe, left upper lobe and left lower lobe concerning for worsening multifocal pneumonia.  Mild COPD.     SUBJECTIVE: Sweats Afebrile Dyspnea stable  VITAL SIGNS: Temp:  [97.9 F (36.6 C)-98.1 F (36.7 C)] 97.9 F (36.6 C) (09/27 0715) Pulse Rate:  [88-110] 103 (09/27 0842) Resp:  [0-22] 17 (09/27 0842) BP: (124-183)/(74-139) 144/83 mmHg (09/27 0840) SpO2:  [91 %-97 %] 94 % (09/27 0842) Weight:  [153 lb (69.4 kg)] 153 lb (69.4 kg) (09/27 0715)  PHYSICAL EXAMINATION: General:  Pleasant, chronically ill appearing male, NAD in bed  Neuro:  Awake, alert, appropriate, MAE  HEENT:  Mm dry, no JVD Cardiovascular:  s1s2 rrr, bounding pulse  Lungs:  resps even non labored on 3L [place], mostly clear, few very fine crackles  Abdomen:  Round, soft, non tender, +bs  Musculoskeletal:  Warm and dry, no edema     Recent Labs Lab 08/07/15 0710 08/08/15 0400 08/09/15 0411  NA 133* 136 135  K 3.8 3.8 3.8  CL 99* 106 101  CO2 25 21* 23  BUN 17 12 20   CREATININE 1.92* 1.76* 1.71*  GLUCOSE 132* 124*  221*    Recent Labs Lab 08/07/15 1407 08/08/15 0400 08/09/15 0411  HGB 10.5* 9.7* 10.6*  HCT 29.1* 27.7* 30.1*  WBC 13.0* 9.7 7.4  PLT 284 290 320   Ct Chest Wo Contrast  08/07/2015   CLINICAL DATA:  Cough, fever, shortness of breath. History of lung cancer.  EXAM: CT CHEST WITHOUT CONTRAST  TECHNIQUE: Multidetector CT imaging of the chest was performed following the standard protocol without IV contrast.  COMPARISON:  Chest CT 07/26/2015  FINDINGS: Progressive right upper lobe interstitial thickening and airspace disease as well as left upper lobe and lower lobe opacities. Findings most compatible with pneumonia. No pleural effusions. Underlying COPD.  Heart is normal size. Aorta is normal caliber. Prior CABG. Pacer in stable position. Small scattered mediastinal lymph nodes, none pathologically enlarged. No hilar or axillary adenopathy.  Chest wall soft tissues are unremarkable. Imaging into the upper abdomen shows no acute findings.  IMPRESSION: Progressive opacities in the right upper lobe, left upper lobe and left lower lobe concerning for worsening multifocal pneumonia.  Mild COPD.   Electronically Signed   By: Rolm Baptise M.D.   On: 08/07/2015 15:33    ASSESSMENT / PLAN:  Persistent bilateral pulmonary infiltrates - evidence of ILD R>L dating back to 2014.  S/p LULobectomy for [place] lung ca in 2007.  Worsening.  Seems more likely inflammatory.    Acute on chronic respiratory failure  COPD   Discussion -  His infiltrates however date back to 2014. He had left upper lobectomy by Dr. [name] in 2009. He also had a CABG with AVR in 2011. PFTs in 2009 showed a ratio of 50, DLCO of 69% and preserved lung volumes and flows.  His scan now so shows significant worsening of infiltrates especially right upper lobe and left lower lobe. He seems to have ongoing symptoms for 3 months -treated with repeated courses of antibiotics and steroids with improvement. This makes me wonder if he has some kind  of steroids responsive interstitial lung disease. Doubt BAC   REC -   Bronchoscopy with biopsy today Ct solumedrol 40 q 12h May add lantus for high sugars  ALVA,RAKESH V. MD [phone]  08/09/2015  8:43 AM

## 2015-08-10 DIAGNOSIS — J449 Chronic obstructive pulmonary disease, unspecified: Secondary | ICD-10-CM

## 2015-08-10 LAB — CBC WITH DIFFERENTIAL/PLATELET
BASOS ABS: 0 10*3/uL (ref 0.0–0.1)
BASOS PCT: 0 %
EOS ABS: 0 10*3/uL (ref 0.0–0.7)
EOS PCT: 0 %
HEMATOCRIT: 28.5 % — AB (ref 39.0–52.0)
Hemoglobin: 10 g/dL — ABNORMAL LOW (ref 13.0–17.0)
Lymphocytes Relative: 2 %
Lymphs Abs: 0.3 10*3/uL — ABNORMAL LOW (ref 0.7–4.0)
MCH: 33.1 pg (ref 26.0–34.0)
MCHC: 35.1 g/dL (ref 30.0–36.0)
MCV: 94.4 fL (ref 78.0–100.0)
MONO ABS: 0.2 10*3/uL (ref 0.1–1.0)
Monocytes Relative: 1 %
NEUTROS ABS: 12.3 10*3/uL — AB (ref 1.7–7.7)
Neutrophils Relative %: 97 %
PLATELETS: 334 10*3/uL (ref 150–400)
RBC: 3.02 MIL/uL — ABNORMAL LOW (ref 4.22–5.81)
RDW: 12.6 % (ref 11.5–15.5)
WBC: 12.8 10*3/uL — ABNORMAL HIGH (ref 4.0–10.5)

## 2015-08-10 LAB — BASIC METABOLIC PANEL
ANION GAP: 11 (ref 5–15)
BUN: 36 mg/dL — ABNORMAL HIGH (ref 6–20)
CALCIUM: 8.8 mg/dL — AB (ref 8.9–10.3)
CO2: 22 mmol/L (ref 22–32)
CREATININE: 1.84 mg/dL — AB (ref 0.61–1.24)
Chloride: 102 mmol/L (ref 101–111)
GFR calc Af Amer: 39 mL/min — ABNORMAL LOW (ref >60)
GFR, EST NON AFRICAN AMERICAN: 34 mL/min — AB (ref >60)
GLUCOSE: 240 mg/dL — AB (ref 65–99)
Potassium: 3.7 mmol/L (ref 3.5–5.1)
Sodium: 135 mmol/L (ref 135–145)

## 2015-08-10 LAB — GLUCOSE, CAPILLARY
GLUCOSE-CAPILLARY: 202 mg/dL — AB (ref 65–99)
GLUCOSE-CAPILLARY: 239 mg/dL — AB (ref 65–99)
Glucose-Capillary: 202 mg/dL — ABNORMAL HIGH (ref 65–99)
Glucose-Capillary: 122 mg/dL — ABNORMAL HIGH (ref 65–99)

## 2015-08-10 MED ORDER — FUROSEMIDE 40 MG PO TABS
40.0000 mg | ORAL_TABLET | Freq: Every day | ORAL | Status: DC
  Administered 2015-08-10 – 2015-08-11 (×2): 40 mg via ORAL
  Filled 2015-08-10 (×2): qty 1

## 2015-08-10 MED ORDER — IPRATROPIUM-ALBUTEROL 0.5-2.5 (3) MG/3ML IN SOLN
3.0000 mL | Freq: Four times a day (QID) | RESPIRATORY_TRACT | Status: DC | PRN
  Administered 2015-08-10: 3 mL via RESPIRATORY_TRACT
  Filled 2015-08-10: qty 3

## 2015-08-10 MED ORDER — METHYLPREDNISOLONE SODIUM SUCC 40 MG IJ SOLR: 40.0000 mg | Freq: Two times a day (BID) | INTRAMUSCULAR | Status: DC

## 2015-08-10 MED ORDER — IPRATROPIUM-ALBUTEROL 0.5-2.5 (3) MG/3ML IN SOLN
3.0000 mL | Freq: Two times a day (BID) | RESPIRATORY_TRACT | Status: DC
  Administered 2015-08-11: 3 mL via RESPIRATORY_TRACT
  Filled 2015-08-10: qty 3

## 2015-08-10 MED ORDER — METHYLPREDNISOLONE SODIUM SUCC 40 MG IJ SOLR
40.0000 mg | Freq: Every day | INTRAMUSCULAR | Status: DC
  Administered 2015-08-10: 40 mg via INTRAVENOUS
  Filled 2015-08-10: qty 1

## 2015-08-10 NOTE — Progress Notes (Signed)
Inpatient Diabetes Program Recommendations  AACE/ADA: New Consensus Statement on Inpatient Glycemic Control (2015)  Target Ranges:  Prepandial:   less than 140 mg/dL      Peak postprandial:   less than 180 mg/dL (1-2 hours)      Critically ill patients:  140 - 180 mg/dL   Review of Glycemic Control  Results for NICCO, REAUME (MRN [id number]) as of 08/10/2015 10:13  Ref. Range 08/09/2015 06:02 08/09/2015 11:18 08/09/2015 16:17 08/09/2015 21:11 08/10/2015 06:29  Glucose-Capillary Latest Ref Range: 65-99 mg/dL 223 (H) 176 (H) 257 (H) 173 (H) 202 (H)       Inpatient Diabetes Program Recommendations:     Increase Lantus to 15 units QHS.  Thank you. Lorenda Peck, RD, LDN, CDE Inpatient Diabetes Coordinator [phone]

## 2015-08-10 NOTE — Progress Notes (Signed)
Name: Kenneth [name] MRN: [id number] DOB: [birth year]    ADMISSION DATE:  08/02/2015 CONSULTATION DATE:  9/26  REFERRING MD :  Mary[place] Pink   CHIEF COMPLAINT:  Pulmonary infiltrates   BRIEF PATIENT DESCRIPTION: 79yo male with hx CAD s/p CABG and AVR 2011, HTN, COPD, hx [place] lung cancer s/p LULobectomy 2007, DM originally admitted to [facility] with PNA.  Treated with abx but had no improvement, Vancomycin stopped r/t AKI and pt was tx to [facility] 9/20 for further treatment.  Despite 2 weeks abx he still shows no significant improvement symptomatically or radiographically with bilar R>L pulmonary infiltrates and PCCM consulted.   No known occupational or environmental exposure.  Pets - 2 dogs. Quit smoking 2007.  H/o LULobectomy ([place]) 2007. Review shows chronic infx dating back to 2014  SIGNIFICANT EVENTS    STUDIES:  CT chest 9/25>>> Progressive opacities in the right upper lobe, left upper lobe and left lower lobe concerning for worsening multifocal pneumonia.  Mild COPD.    SUBJECTIVE:  Feels breathing is better.  Very hungry from steroids.  Has been OOB but remains weak.   VITAL SIGNS: Temp:  [97.5 F (36.4 C)-97.9 F (36.6 C)] 97.5 F (36.4 C) (09/28 0504) Pulse Rate:  [94-108] 103 (09/28 0504) Resp:  [10-20] 20 (09/28 0504) BP: (114-136)/(71-82) 136/71 mmHg (09/28 0504) SpO2:  [92 %-96 %] 96 % (09/28 0845) Weight:  [154 lb 15.7 oz (70.3 kg)] 154 lb 15.7 oz (70.3 kg) (09/28 0504)  PHYSICAL EXAMINATION: General:  Pleasant, chronically ill appearing male, NAD sitting on side of bed   Neuro:  Awake, alert, appropriate, MAE  HEENT:  Mm dry, no JVD Cardiovascular:  s1s2 rrr Lungs:  resps even non labored on 2L [place], mostly clear, few very fine crackles  Abdomen:  Round, soft, non tender, +bs  Musculoskeletal:  Warm and dry, no edema     Recent Labs Lab 08/08/15 0400 08/09/15 0411 08/10/15 0428  NA 136 135 135  K 3.8 3.8 3.7  CL 106 101 102  CO2 21* 23  22  BUN 12 20 36*  CREATININE 1.76* 1.71* 1.84*  GLUCOSE 124* 221* 240*    Recent Labs Lab 08/08/15 0400 08/09/15 0411 08/10/15 0428  HGB 9.7* 10.6* 10.0*  HCT 27.7* 30.1* 28.5*  WBC 9.7 7.4 12.8*  PLT 290 320 334   Dg Chest Port 1 View  08/09/2015   CLINICAL DATA:  Status post bronchoscopy with biopsy  EXAM: PORTABLE CHEST - 1 VIEW  COMPARISON:  08/07/2015  FINDINGS: Cardiac shadow is stable. A defibrillator is again noted. Patchy airspace changes are again identified in both lungs similar to that seen on prior exam. No pneumothorax is noted following recent bronchoscopy. No bony abnormality is seen.  IMPRESSION: Stable appearance of the chest without acute abnormality.   Electronically Signed   By: Inez Catalina M.D.   On: 08/09/2015 09:07   Dg C-arm Bronchoscopy  08/09/2015   CLINICAL DATA:    C-ARM BRONCHOSCOPY  Fluoroscopy was utilized by the requesting physician.  No radiographic  interpretation.     ASSESSMENT / PLAN:  Persistent bilateral pulmonary infiltrates - Infiltrates date back to 2014 but scan now shows significant worsening.  S/p LULobectomy for [place] lung ca in 2007.  Seems more likely inflammatory.    Acute on chronic respiratory failure  COPD  - PFTs in 2009 showed a ratio of 50, DLCO of 69% and preserved lung volumes and flows.   BAL 9/27>>> no organisms  seen>>>  PLAN -  Cont IV steroids - solumedrol 40 q 12  F/u BAL and FOB bx  Continue supplemental O2  - on 2L home O2  Mobilize  Pulmonary hygiene  BD's  consider d/c abx -- has had repeated courses of abx over last 3 months, now on D8 IV abx this admit Likely nearing ready for d/c home in next few days  Will need outpt pulm f/u   Nickolas Madrid, NP 08/10/2015  9:03 AM Pager: [phone] or [phone]

## 2015-08-10 NOTE — Progress Notes (Signed)
[facility] PROGRESS NOTE  Kenneth [name] TKZ:[id number] DOB: May 11, 1936 DOA: 08/02/2015  PCP: [name]., MD  Brief HPI: 79 year old Caucasian male with a past medical history of AAA, currently, artery disease, hypertension, COPD, type 2 diabetes who was admitted to [facility] with pneumonia. He had been there for a week. He was on vancomycin and Zosyn. Patient had been improving and then was noted to have elevation in his creatinine. Creatinine was normal at the time of admission and then it was up to 3.72. Family requested transfer to [facility]. Patient was started on IV fluids. He was started on Fortaz. His renal function started improving. However, 2 days ago he developed worsening shortness of breath. CT scan of the chest showed bilateral pulmonary infiltrates. Pulmonology was consulted. Bronchoscopy was performed on 9/27.  Past medical history:  Past Medical History  Diagnosis Date  . Abdominal aortic aneurysm without mention of rupture   . Kidney stones 05/03/09    HEMATURIA,CYSTOSCOPY DONE  . CAD (coronary artery disease), native coronary artery 03/10/2014    02/28/10 AVR with tissue valve, Cabg with LIMA to LAD, SVG to int, and SVG to RCA Dr. Roxan Hockey  Cath 02/16/2010 Calcified coronaries  LM calcified, LAD, diffuse disease, circ calcified with 60% mid, RCA calcified with 80% prox and 50% mid  36 mm Peak gradient, severe aortic stenosis  2003 Cypher stent in distal RCA , PTCA of diagonal 1996 PTCA of RCA in 2 places with restenosis treated again   . Hypertensive heart disease   . Diabetes mellitus type 2, noninsulin dependent 03/10/2014  . COPD   . Chronic dyspnea   . Chronic diastolic heart failure   . Lumbar disc disease   . Hyperlipidemia   . History of lung cancer 12/13/2005    LUL lobe Stage 1 non small cell cancer treated with resection Dr. [name]   . GERD 08/06/2008  . Atrial fibrillation   . Cancer 2007    Lung  . Hypertension   .  LBBB (left bundle branch block)   . AICD (automatic cardioverter/defibrillator) present 04/13/2015    icd   . CHF (congestive heart failure)     Consultants: None  Procedures:   Bronchoscopy 9/27   Antibiotics: On Doxycycline   Subjective: Pt has no new complaints. Breathing is better than when he first came in  Objective: Vital Signs  Filed Vitals:   08/10/15 0102 08/10/15 0504 08/10/15 0845 08/10/15 1439  BP:  136/71    Pulse:  103    Temp:  97.5 F (36.4 C)    TempSrc:  Oral    Resp:  20    Height:      Weight:  70.3 kg (154 lb 15.7 oz)    SpO2: 93% 94% 96% 96%    Intake/Output Summary (Last 24 hours) at 08/10/15 1613 Last data filed at 08/10/15 1255  Gross per 24 hour  Intake    360 ml  Output    950 ml  Net   -590 ml   Filed Weights   08/09/15 0604 08/09/15 0715 08/10/15 0504  Weight: 69.4 kg (153 lb) 69.4 kg (153 lb) 70.3 kg (154 lb 15.7 oz)    General appearance: alert, cooperative, appears stated age and in no distress Resp: equal chest rise, No wheezing or crackles. Cardio: regular rate and rhythm, S1, S2 normal, no murmur, click, rub or gallop GI: soft, non-tender; bowel sounds normal; no masses,  no organomegaly Neurologic: Alert and oriented  3. No focal neurological deficits are noted.  Lab Results:  Basic Metabolic Panel:  Recent Labs Lab 08/06/15 0323 08/07/15 0710 08/08/15 0400 08/09/15 0411 08/10/15 0428  NA 133* 133* 136 135 135  K 3.8 3.8 3.8 3.8 3.7  CL 101 99* 106 101 102  CO2 24 25 21* 23 22  GLUCOSE 129* 132* 124* 221* 240*  BUN 27* 17 12 20  36*  CREATININE 1.96* 1.92* 1.76* 1.71* 1.84*  CALCIUM 8.4* 8.7* 8.7* 8.8* 8.8*   Liver Function Tests: No results for input(s): AST, ALT, ALKPHOS, BILITOT, PROT, ALBUMIN in the last 168 hours. CBC:  Recent Labs Lab 08/06/15 0323 08/07/15 1407 08/08/15 0400 08/09/15 0411 08/10/15 0428  WBC 10.9* 13.0* 9.7 7.4 12.8*  NEUTROABS  --   --   --  7.0 12.3*  HGB 10.4* 10.5* 9.7*  10.6* 10.0*  HCT 29.9* 29.1* 27.7* 30.1* 28.5*  MCV 95.8 93.6 94.2 94.1 94.4  PLT 238 284 290 320 334    CBG:  Recent Labs Lab 08/09/15 1118 08/09/15 1617 08/09/15 2111 08/10/15 0629 08/10/15 1126  GLUCAP 176* 257* 173* 202* 202*    Recent Results (from the past 240 hour(s))  MRSA PCR Screening     Status: None   Collection Time: 08/02/15  8:18 PM  Result Value Ref Range Status   MRSA by PCR NEGATIVE NEGATIVE Final    Comment:        The GeneXpert MRSA Assay (FDA approved for NASAL specimens only), is one component of a comprehensive MRSA colonization surveillance program. It is not intended to diagnose MRSA infection nor to guide or monitor treatment for MRSA infections.   Culture, blood (routine x 2)     Status: None (Preliminary result)   Collection Time: 08/07/15  1:55 PM  Result Value Ref Range Status   Specimen Description BLOOD LEFT HAND  Final   Special Requests BOTTLES DRAWN AEROBIC AND ANAEROBIC 5CC  Final   Culture NO GROWTH 3 DAYS  Final   Report Status PENDING  Incomplete  Culture, blood (routine x 2)     Status: None (Preliminary result)   Collection Time: 08/07/15  2:07 PM  Result Value Ref Range Status   Specimen Description BLOOD LEFT ANTECUBITAL  Final   Special Requests IN PEDIATRIC BOTTLE 2CC  Final   Culture NO GROWTH 3 DAYS  Final   Report Status PENDING  Incomplete  AFB culture with smear     Status: None (Preliminary result)   Collection Time: 08/09/15  8:30 AM  Result Value Ref Range Status   Specimen Description BRONCHIAL ALVEOLAR LAVAGE  Final   Special Requests Normal  Final   Acid Fast Smear   Final    NO ACID FAST BACILLI SEEN Performed at 08/11/15    Culture   Final    CULTURE WILL BE EXAMINED FOR 6 WEEKS BEFORE ISSUING A FINAL REPORT Performed at [facility]    Report Status PENDING  Incomplete  Culture, bal-quantitative     Status: None (Preliminary result)   Collection Time: 08/09/15  8:30 AM    Result Value Ref Range Status   Specimen Description BRONCHIAL ALVEOLAR LAVAGE  Final   Special Requests Normal  Final   Gram Stain   Final    FEW WBC PRESENT,BOTH PMN AND MONONUCLEAR NO SQUAMOUS EPITHELIAL CELLS SEEN NO ORGANISMS SEEN Performed at 08/11/15    Culture PENDING  Incomplete   Report Status PENDING  Incomplete  Fungus Culture  with Smear     Status: None (Preliminary result)   Collection Time: 08/09/15  8:30 AM  Result Value Ref Range Status   Specimen Description BRONCHIAL ALVEOLAR LAVAGE  Final   Special Requests Normal  Final   Fungal Smear   Final    NO YEAST OR FUNGAL ELEMENTS SEEN Performed at [facility]    Culture   Final    CULTURE IN PROGRESS FOR FOUR WEEKS Performed at [facility]    Report Status PENDING  Incomplete      Studies/Results: Dg Chest Port 1 View  08/09/2015   CLINICAL DATA:  Status post bronchoscopy with biopsy  EXAM: PORTABLE CHEST - 1 VIEW  COMPARISON:  08/07/2015  FINDINGS: Cardiac shadow is stable. A defibrillator is again noted. Patchy airspace changes are again identified in both lungs similar to that seen on prior exam. No pneumothorax is noted following recent bronchoscopy. No bony abnormality is seen.  IMPRESSION: Stable appearance of the chest without acute abnormality.   Electronically Signed   By: Inez Catalina M.D.   On: 08/09/2015 09:07   Dg C-arm Bronchoscopy  08/09/2015   CLINICAL DATA:    C-ARM BRONCHOSCOPY  Fluoroscopy was utilized by the requesting physician.  No radiographic  interpretation.     Medications:  Scheduled: . butamben-tetracaine-benzocaine  1 spray Topical Once  . doxycycline  100 mg Oral Q12H  . enoxaparin (LOVENOX) injection  30 mg Subcutaneous Q24H  . finasteride  5 mg Oral Daily  . insulin aspart  0-20 Units Subcutaneous TID WC  . insulin aspart  0-5 Units Subcutaneous QHS  . insulin glargine  10 Units Subcutaneous Daily  . ipratropium-albuterol  3 mL Nebulization Q6H   . lidocaine  1 application Topical Once  . methylPREDNISolone (SOLU-MEDROL) injection  40 mg Intravenous Daily  . pantoprazole  40 mg Oral Q1200   Continuous: . sodium chloride 10 mL/hr at 08/10/15 6754   GBE:EFEOFHQR (SUBLIMAZE) injection, ondansetron **OR** ondansetron (ZOFRAN) IV, phenylephrine  Assessment/Plan:  Principal Problem:   AKI (acute kidney injury) Active Problems:   Hyperlipidemia   COPD (chronic obstructive pulmonary disease)   GERD   History of lung cancer   Abdominal aortic aneurysm   Diabetes mellitus type 2, noninsulin dependent   ILD (interstitial lung disease)   Pulmonary infiltrates   Healthcare associated pneumonia with worsening dyspnea and bilateral infiltrates Patient is feeling better. Underwent bronchoscopy this morning. Cultures have been sent. Transbronchial biopsies were obtained. Patient was started on steroids per pulmonology. He seems to be better. This could be an inflammatory process over and above an infectious process. Patient's ESR is greater than 140. Rheumatoid factor 526. Eosinophils count is normal. CT scan of the chest done on 9/25 showed left-sided infiltrates. These are progressive compared to previous CT done about 2 weeks ago. Doxycycline was added 9/25. Patient completed almost a week of vancomycin and Zosyn at [facility]. No positive blood culture reports have been noted. Patient was started on Fortaz and azithromycin here. He did not tolerate the IV infusion of azithromycin. And so this was discontinued.  - currently on doxycyline  Acute renal failure Thought to be secondary to ATN from hypotension. He was also on nephrotoxic agents including vancomycin. He was also getting Ace inhibitors and diuretics. Blood pressure has improved. Creatinine is improving. He is making adequate amount of urine. Continue IV fluids for now, but at a lower rate. Renal ultrasound report reviewed. No hydronephrosis noted. Renal cysts were identified. He was  seen by nephrologist at [facility].   Elevated blood sugars/steroid-induced diabetes Patient denies any history of diabetes. He states that his blood sugars tend to go high due to prednisone. He doesn't take any medications for diabetes at home. HbA1c is 7.3. Blood sugars are once again elevated due to initiate steroids. Continue Lantus. Continue sliding scale coverage.   History of COPD Stable. Bronchodilators as needed.  History of lung cancer  He is status post surgical removal in the past. Currently stable. No evidence for recurrence.  History of abdominal aortic aneurysm Followed closely by vascular surgery. AAA findings noted on ultrasound, which was compared to ultrasound findings from last year. He was seen by Dr. Donnetta Hutching within the last 2 months and was found to have stable AAA. At that time plan was for continued monitoring as outpatient. Patient denies any symptoms. Abdomen is benign.  History of essential hypertension  Patient's blood pressure was low at [facility]. Has improved with IV fluids. His antihypertensives are on hold.   History of ischemic cardiomyopathy with prior bypass  Ejection fraction is known to be about 35%. He also has had aortic valve replacement. He had a AICD placed in June.  - Will start patient's lasix today  DVT Prophylaxis: Lovenox     Code Status: Full code Family Communication: Discussed with the patient and his wife Disposition Plan: Await improvement. Await culture data from BAL.     LOS: 8 days   Velvet Bathe  [facility] Pager [phone]  08/10/2015, 4:13 PM  If 7PM-7AM, please contact night-coverage at [email or web address], password [facility]

## 2015-08-10 NOTE — Progress Notes (Signed)
SATURATION QUALIFICATIONS: (This note is used to comply with regulatory documentation for home oxygen)  Patient Saturations on 2 Liters of oxygen while Ambulating = 91%%  Please briefly explain why patient needs home oxygen: Pt on 2L.min supplemental O2 throughout session as this is the amount of O2 he has been on at home for the past 6 months. O2 sats were at 98% at rest on 2L/min, and dropped to 91% during gait training.  Please see following PT evaluation for further mobility details.   Rolinda Roan, PT, DPT Acute Rehabilitation Services Pager: [phone]

## 2015-08-10 NOTE — Care Management Note (Signed)
Case Management Note  Patient Details  Name: Kenneth [name] MRN: [id number] Date of Birth: [birth year]  Subjective/Objective:      Adm w kidney injury, hypotension              Action/Plan: lives w fam, pcp dr vyas.  Pt is on home O2 continuous. CM will continue to monitor for disposition needs.    Expected Discharge Date:                  Expected Discharge Plan:  [place]  In-House Referral:     Discharge planning Services     Post Acute Care Choice:    Choice offered to:     DME Arranged:    DME Agency:     HH Arranged:    [place] Agency:     Status of Service:     Medicare Important Message Given:  Yes-fourth notification given Date Medicare IM Given:    Medicare IM give by:    Date Additional Medicare IM Given:    Additional Medicare Important Message give by:     If discussed at [place] of Stay Meetings, dates discussed:  08/09/15  Additional Comments: ur review done 08/10/2015 Pt had bronchoscopy 08/09/15, pt remains on IV steroids and IV antibiotics.  CM communicated with bedside nurse need for ambulation note required for qualification for home 02.  08/09/15 CM contacted by [name] at [place], pt will use agency upon discharge for home O2.  [name] requested ambulating note prior to discharge.  CM requested order from MD.  CM will continue to follow for disposition needs  08/05/15: CM assessed pt, pt stated he does have portable oxygen tank in his car located in hospital parking with enough oxygen for transport home. Maryclare Labrador, RN 08/10/2015, 11:32 AM

## 2015-08-10 NOTE — Evaluation (Signed)
Physical Therapy Evaluation and Discharge Patient Details Name: Kenneth [name] MRN: [id number] DOB: [birth year] Today's Date: 08/10/2015   History of Present Illness  79 y.o. male with a past medical history of AAA, CAD, hypertension, lung cancer (status post partial left upper lobe resection), COPD, type 2 diabetes, hyperlipidemia who is being transferred from [facility] due to acute kidney injury. The patient had been treated there for a week for bilateral pneumonia  Clinical Impression  Patient evaluated by Physical Therapy with no further acute PT needs identified. All education has been completed and the patient has no further questions. At the time of PT eval pt was able to perform transfers and ambulation with mod I, and therapist managed lines/IV pole. RW was used for energy conservation. See below for any follow-up Physial Therapy or equipment needs. PT is signing off. Thank you for this referral.  Pt on 2L/min supplemental O2 throughout session as this is the amount of oxygen pt has been using at home for the past 6 months. Sats remained 91-98% throughout session.      Follow Up Recommendations Other (comment) (Pulmonary rehab?)    Equipment Recommendations  None recommended by PT    Recommendations for Other Services       Precautions / Restrictions Precautions Precautions: None Precaution Comments: watch HR and O2 sats Restrictions Weight Bearing Restrictions: No      Mobility  Bed Mobility               General bed mobility comments: Pt received sitting up in bedside chair with wife present.  Transfers Overall transfer level: Needs assistance Equipment used: Rolling walker (2 wheeled) Transfers: Sit to/from Stand Sit to Stand: Modified independent (Device/Increase time)         General transfer comment: Was aware of lines. Therapist managed IV pole.   Ambulation/Gait Ambulation/Gait assistance: Modified independent  (Device/Increase time) Ambulation Distance (Feet): 450 Feet Assistive device: Rolling walker (2 wheeled) Gait Pattern/deviations: Step-through pattern;Decreased stride length;Trunk flexed Gait velocity: Decreased for energy conservation Gait velocity interpretation: Below normal speed for age/gender General Gait Details: Pt demonstrated good breathing technique throughout gait training. 1 standing rest break due to DOE. On 2L/min supplemental O2 and sats remained between 91-98%.   Stairs            Wheelchair Mobility    Modified Rankin (Stroke Patients Only)       Balance Overall balance assessment: No apparent balance deficits (not formally assessed)                                           Pertinent Vitals/Pain Pain Assessment: No/denies pain    Home Living Family/patient expects to be discharged to:: Private residence Living Arrangements: Spouse/significant other Available Help at Discharge: Family;Available 24 hours/day Type of Home: House Home Access: Stairs to enter   [employer] of Steps: 1 Home Layout: Two level;Bed/bath upstairs Home Equipment: Walker - 4 wheels      Prior Function Level of Independence: Independent         Comments: Very active. Has been wearing home O2 for the past 6 months     Hand Dominance   Dominant Hand: Right    Extremity/Trunk Assessment   Upper Extremity Assessment: Overall WFL for tasks assessed           Lower Extremity Assessment: Overall WFL for tasks assessed  Cervical / Trunk Assessment: Normal  Communication   Communication: No difficulties  Cognition Arousal/Alertness: Awake/alert Behavior During Therapy: WFL for tasks assessed/performed Overall Cognitive Status: Within Functional Limits for tasks assessed                      General Comments      Exercises        Assessment/Plan    PT Assessment Patent does not need any further PT services   PT Diagnosis Difficulty walking   PT Problem List    PT Treatment Interventions     PT Goals (Current goals can be found in the Care Plan section) Acute Rehab PT Goals Patient Stated Goal: not stated PT Goal Formulation: All assessment and education complete, DC therapy    Frequency     Barriers to discharge        Co-evaluation               End of Session Equipment Utilized During Treatment: Oxygen Activity Tolerance: Patient tolerated treatment well Patient left: in chair;with call bell/phone within reach;Other (comment) (respiratory therapist present for breathing treatment) Nurse Communication: Mobility status         Time: 0964-3838 PT Time Calculation (min) (ACUTE ONLY): 28 min   Charges:   PT Evaluation $Initial PT Evaluation Tier I: 1 Procedure PT Treatments $Gait Training: 8-22 mins   PT G Codes:        Rolinda Roan 09-Sep-2015, 3:03 PM   Rolinda Roan, PT, DPT Acute Rehabilitation Services Pager: [phone]

## 2015-08-11 ENCOUNTER — Encounter (HOSPITAL_COMMUNITY): Payer: Self-pay | Admitting: Pulmonary Disease

## 2015-08-11 DIAGNOSIS — J449 Chronic obstructive pulmonary disease, unspecified: Secondary | ICD-10-CM

## 2015-08-11 LAB — GLUCOSE, CAPILLARY
GLUCOSE-CAPILLARY: 156 mg/dL — AB (ref 65–99)
Glucose-Capillary: 119 mg/dL — ABNORMAL HIGH (ref 65–99)

## 2015-08-11 LAB — CYCLIC CITRUL PEPTIDE ANTIBODY, IGG/IGA: CCP Antibodies IgG/IgA: 8 units (ref 0–19)

## 2015-08-11 MED ORDER — PREDNISONE 20 MG PO TABS
40.0000 mg | ORAL_TABLET | Freq: Every day | ORAL | Status: DC
  Administered 2015-08-11: 40 mg via ORAL
  Filled 2015-08-11: qty 2

## 2015-08-11 MED ORDER — PREDNISONE 10 MG PO TABS: 10.0000 mg | ORAL_TABLET | ORAL | Status: DC

## 2015-08-11 MED ORDER — DOXYCYCLINE HYCLATE 100 MG PO TABS: 100.0000 mg | ORAL_TABLET | Freq: Two times a day (BID) | ORAL | Status: DC

## 2015-08-11 MED ORDER — GLIPIZIDE 5 MG PO TABS: 2.5000 mg | ORAL_TABLET | Freq: Every day | ORAL | Status: DC

## 2015-08-11 NOTE — Progress Notes (Signed)
   Name: Kenneth [name] MRN: [id number] DOB: 06-22-1936    ADMISSION DATE:  08/02/2015 CONSULTATION DATE:  9/26  REFERRING MD :  [place] Pink   CHIEF COMPLAINT:  Pulmonary infiltrates   BRIEF PATIENT DESCRIPTION: 78yo male with hx CAD s/p CABG and AVR 2011, HTN, COPD, hx [place] lung cancer s/p LULobectomy 2007, DM originally admitted to [facility] hospital with PNA.  Treated with abx but had no improvement, Vancomycin stopped r/t AKI and pt was tx to [facility] 9/20 for further treatment.  Despite 2 weeks abx he still shows no significant improvement symptomatically or radiographically with bilar R>L pulmonary infiltrates and PCCM consulted.   No known occupational or environmental exposure.  Pets - 2 dogs. Quit smoking 2007.  H/o LULobectomy ([place]) 2007. Review shows chronic infx dating back to 2014  SIGNIFICANT EVENTS    STUDIES:  CT chest 9/25>>> Progressive opacities in the right upper lobe, left upper lobe and left lower lobe concerning for worsening multifocal pneumonia.  Mild COPD.   SUBJECTIVE:  Feeling much better.  Ambulated in hall yesterday.  Wants to go home.   VITAL SIGNS: Temp:  [97.4 F (36.3 C)-98.2 F (36.8 C)] 97.6 F (36.4 C) (09/29 0603) Pulse Rate:  [103-109] 103 (09/29 0603) Resp:  [18-180] 180 (09/29 0603) BP: (133-154)/(73-89) 154/89 mmHg (09/29 0603) SpO2:  [95 %-97 %] 97 % (09/29 0841) Weight:  [153 lb 3.5 oz (69.5 kg)] 153 lb 3.5 oz (69.5 kg) (09/29 0549)  PHYSICAL EXAMINATION: General:  Pleasant, chronically ill appearing male, NAD up in chair   Neuro:  Awake, alert, appropriate, MAE  HEENT:  Mm dry, no JVD Cardiovascular:  s1s2 rrr Lungs:  resps even non labored on 2L [place], mostly clear  Abdomen:  Round, soft, non tender, +bs  Musculoskeletal:  Warm and dry, scant BLE edema     Recent Labs Lab 08/08/15 0400 08/09/15 0411 08/10/15 0428  NA 136 135 135  K 3.8 3.8 3.7  CL 106 101 102  CO2 21* 23 22  BUN 12 20 36*  CREATININE 1.76* 1.71*  1.84*  GLUCOSE 124* 221* 240*    Recent Labs Lab 08/08/15 0400 08/09/15 0411 08/10/15 0428  HGB 9.7* 10.6* 10.0*  HCT 27.7* 30.1* 28.5*  WBC 9.7 7.4 12.8*  PLT 290 320 334   No results found.  ASSESSMENT / PLAN:  Persistent bilateral pulmonary infiltrates - Infiltrates date back to 2014 but scan now shows significant worsening.  S/p LULobectomy for [place] lung ca in 2007.  Seems more likely inflammatory.    Acute on chronic respiratory failure  COPD  - PFTs in 2009 showed a ratio of 50, DLCO of 69% and preserved lung volumes and flows.   BAL 9/27>>> no organisms seen>>>normal flora Cytology 9/27>>> neg  ESR = >140  RF>> POS  ANA= NEG  CCP>>>  PLAN -  Change steroids to PO pred with slow taper  F/u cultures  F/u CCP  Continue supplemental O2  - on 2L home O2  Mobilize  Pulmonary hygiene  BD's  Ready for d/c from pulm standpoint with outpt pulm f/u with PFT's - arranged   Discussed at length with pt 9/29   Nickolas Madrid, NP 08/11/2015  9:54 AM Pager: [phone] or [phone]

## 2015-08-11 NOTE — Progress Notes (Signed)
Pt discharge education and instructions completed with pt and family at bedside; all voices understanding and denies any questions. Pt discharge home with spouse to transport him home. Pt handed his prescriptions for doxycycline, prednisone; and glipizide. Pt IV and telemetry removed; pt transported off unit via wheelchair with belongings and family at side. Francis Gaines Kuffour RN.

## 2015-08-11 NOTE — Care Management Important Message (Signed)
Important Message  Patient Details  Name: Kenneth [name] MRN: [id number] Date of Birth: 1935-11-21   Medicare Important Message Given:  Yes-second notification given    Nathen May 08/11/2015, 11:57 AM

## 2015-08-11 NOTE — Discharge Summary (Signed)
Physician Discharge Summary  Kenneth [name]:[id number] DOB: 10-18-1936 DOA: 08/02/2015  PCP: Kenneth [name]., MD  Admit date: 08/02/2015 Discharge date: 08/11/2015  Time spent: > 35 minutes  Recommendations for Outpatient Follow-up:  1. Please follow up with your pulmonologist after discharge.  Discharge Diagnoses:  Principal Problem:   AKI (acute kidney injury) Active Problems:   Hyperlipidemia   COPD (chronic obstructive pulmonary disease)   GERD   History of lung cancer   Abdominal aortic aneurysm   Diabetes mellitus type 2, noninsulin dependent   ILD (interstitial lung disease)   Pulmonary infiltrates   Discharge Condition: stable  Diet recommendation: heart healthy  Filed Weights   08/09/15 0715 08/10/15 0504 08/11/15 0549  Weight: 69.4 kg (153 lb) 70.3 kg (154 lb 15.7 oz) 69.5 kg (153 lb 3.5 oz)    History of present illness:  From original HPI: 79 y.o. male with a past medical history of AAA, CAD, hypertension, lung cancer (status post partial left upper lobe resection), COPD, type 2 diabetes, hyperlipidemia who is being transferred from [facility] due to acute kidney injury. The patient had been treated there for a week for bilateral pneumonia with vancomycin and Zosyn. He was also taking his regular antihypertensive medications that include furosemide, valsartan and hydrochlorothiazide. He had been steadily improving, but 2 days ago started complaining about weakness and nausea.  Hospital Course:  Healthcare associated pneumonia with worsening dyspnea and bilateral infiltrates Patient is feeling better. Underwent bronchoscopy this morning. Cultures have been sent. Transbronchial biopsies were obtained. Patient was started on steroids per pulmonology. He seems to be better. This could be an inflammatory process over and above an infectious process. Patient's ESR is greater than 140. Rheumatoid factor 526. Eosinophils count is normal. CT scan of the  chest done on 9/25 showed left-sided infiltrates. These are progressive compared to previous CT done about 2 weeks ago. Doxycycline was added 9/25. Patient completed almost a week of vancomycin and Zosyn at [facility]. No positive blood culture reports have been noted. Patient was started on Fortaz and azithromycin here. He did not tolerate the IV infusion of azithromycin. And so this was discontinued.  - Will d/c on doxycyline  Acute renal failure Thought to be secondary to ATN from hypotension. He was also on nephrotoxic agents including vancomycin. He was also getting Ace inhibitors and diuretics. Blood pressure has improved. Creatinine is improving. He is making adequate amount of urine. Renal ultrasound report reviewed. No hydronephrosis noted. Renal cysts were identified. He was seen by nephrologist at [facility].   Elevated blood sugars/steroid-induced diabetes Patient denies any history of diabetes. He states that his blood sugars tend to go high due to prednisone. He doesn't take any medications for diabetes at home. HbA1c is 7.3.  - Will d/c on glipizide while patient is on steroids. He is to f/u with his pcp for further evaluation and recommendations.  History of COPD Stable. Bronchodilators as needed.  History of lung cancer  He is status post surgical removal in the past. Currently stable. No evidence for recurrence.  History of abdominal aortic aneurysm Followed closely by vascular surgery. AAA findings noted on ultrasound, which was compared to ultrasound findings from last year. He was seen by Dr. Donnetta Hutching within the last 2 months and was found to have stable AAA. At that time plan was for continued monitoring as outpatient. Patient denies any symptoms. Abdomen is benign.  History of essential hypertension  Patient's blood pressure was low at [facility]  [place] back on his B blocker on d/c.  History of ischemic cardiomyopathy with prior bypass  Ejection  fraction is known to be about 35%. He also has had aortic valve replacement. He had a AICD placed in June.  - Will continue patient's lasix on d/c  Procedures:  None  Consultations:  Pulmonologist  Discharge Exam: Filed Vitals:   08/11/15 1036  BP: 157/83  Pulse: 108  Temp:   Resp:     General: Pt in nad, alert and awake Cardiovascular: rrr, no mrg Respiratory: cta bl, no wheezes  Discharge Instructions   Discharge Instructions    Call MD for:  difficulty breathing, headache or visual disturbances    Complete by:  As directed      Call MD for:  temperature >100.4    Complete by:  As directed      Diet - low sodium heart healthy    Complete by:  As directed      Diet - low sodium heart healthy    Complete by:  As directed      Discharge instructions    Complete by:  As directed   Please follow-up with pulmonology in the next one or 2 weeks or sooner should any new concerns arise.     Increase activity slowly    Complete by:  As directed      Increase activity slowly    Complete by:  As directed           Current Discharge Medication List    START taking these medications   Details  doxycycline (VIBRA-TABS) 100 MG tablet Take 1 tablet (100 mg total) by mouth every 12 (twelve) hours. Qty: 4 tablet, Refills: 0    predniSONE (DELTASONE) 10 MG tablet Take 1 tablet (10 mg total) by mouth as directed. Take 40 mg by mouth for the next 3 days, then take 30 mg by mouth next 3 days, then take 20 mg by mouth for the next 3 days, then take 10 mg by mouth for the next 3 days. Qty: 40 tablet, Refills: 0      CONTINUE these medications which have NOT CHANGED   Details  albuterol (PROVENTIL) (2.5 MG/3ML) 0.083% nebulizer solution Take 2.5 mg by nebulization every 6 (six) hours as needed for wheezing or shortness of breath.     aspirin 81 MG tablet Take 81 mg by mouth daily.     furosemide (LASIX) 40 MG tablet Take 40 mg by mouth daily.     omeprazole (PRILOSEC) 40 MG  capsule Take 40 mg by mouth daily.     potassium chloride (K-DUR,KLOR-CON) 10 MEQ tablet Take 10 mEq by mouth every other day.     nebivolol (BYSTOLIC) 5 MG tablet Take 5 mg by mouth daily.      STOP taking these medications     valsartan-hydrochlorothiazide (DIOVAN-HCT) 320-25 MG per tablet      finasteride (PROSCAR) 5 MG tablet        Allergies  Allergen Reactions  . Ciprocin-Fluocin-Procin [Fluocinolone] Nausea Only  . Clarithromycin Nausea Only    Altered taste  . Hydrocodone-Acetaminophen Nausea Only   Follow-up Information    Follow up with [name]., MD On 08/29/2015.   Specialty:  Pulmonary Disease   Why:  2-30 pm   Contact information:   [address] [address] [phone]        The results of significant diagnostics from this hospitalization (including imaging, microbiology, ancillary and laboratory)  are listed below for reference.    Significant Diagnostic Studies: Ct Chest Wo Contrast  08/07/2015   CLINICAL DATA:  Cough, fever, shortness of breath. History of lung cancer.  EXAM: CT CHEST WITHOUT CONTRAST  TECHNIQUE: Multidetector CT imaging of the chest was performed following the standard protocol without IV contrast.  COMPARISON:  Chest CT 07/26/2015  FINDINGS: Progressive right upper lobe interstitial thickening and airspace disease as well as left upper lobe and lower lobe opacities. Findings most compatible with pneumonia. No pleural effusions. Underlying COPD.  Heart is normal size. Aorta is normal caliber. Prior CABG. Pacer in stable position. Small scattered mediastinal lymph nodes, none pathologically enlarged. No hilar or axillary adenopathy.  Chest wall soft tissues are unremarkable. Imaging into the upper abdomen shows no acute findings.  IMPRESSION: Progressive opacities in the right upper lobe, left upper lobe and left lower lobe concerning for worsening multifocal pneumonia.  Mild COPD.   Electronically Signed   By: Rolm Baptise  M.D.   On: 08/07/2015 15:33   US Renal  08/03/2015   CLINICAL DATA:  Acute renal failure.  Diabetes and hypertension.  EXAM: RENAL / URINARY TRACT ULTRASOUND COMPLETE  COMPARISON:  08/02/2015  FINDINGS: Right Kidney:  Length: 10.3 cm. Echogenicity within normal limits. No mass or hydronephrosis visualized.  Left Kidney:  Length: 10.8 cm. Echogenicity within normal limits. No mass or hydronephrosis visualized. Stable appearance of several renal cysts, 1 measuring up to 2.3 cm in diameter and the other measuring up to 2.6 cm in diameter. These appear simple.  Bladder:  Foley catheter noted in the urinary bladder.  Other: Incidentally seen during imaging is an infrarenal abdominal aortic aneurysm measuring 5.7 cm transverse by 5.2 cm anterior-posterior (formerly 5.2 by 4.9 cm on 09/07/2014).  IMPRESSION: 1. An infrarenal abdominal aortic aneurysm has enlarged and now measures 5.7 by 5.2 cm. Vascular surgery consultation recommended due to increased risk of rupture for AAA >5.5 cm. This recommendation follows ACR consensus guidelines: White Paper of the ACR Incidental Findings Committee II on Vascular Findings. J Am Coll Radiol 2013; 10:789-794. 2. Stable appearance of several small left renal cysts. No hydronephrosis; renal echogenicity and echotexture normal.   Electronically Signed   By: Van Clines M.D.   On: 08/03/2015 15:57   Dg Chest Port 1 View  08/09/2015   CLINICAL DATA:  Status post bronchoscopy with biopsy  EXAM: PORTABLE CHEST - 1 VIEW  COMPARISON:  08/07/2015  FINDINGS: Cardiac shadow is stable. A defibrillator is again noted. Patchy airspace changes are again identified in both lungs similar to that seen on prior exam. No pneumothorax is noted following recent bronchoscopy. No bony abnormality is seen.  IMPRESSION: Stable appearance of the chest without acute abnormality.   Electronically Signed   By: Inez Catalina M.D.   On: 08/09/2015 09:07   Dg C-arm Bronchoscopy  08/09/2015   CLINICAL  DATA:    C-ARM BRONCHOSCOPY  Fluoroscopy was utilized by the requesting physician.  No radiographic  interpretation.     Microbiology: Recent Results (from the past 240 hour(s))  MRSA PCR Screening     Status: None   Collection Time: 08/02/15  8:18 PM  Result Value Ref Range Status   MRSA by PCR NEGATIVE NEGATIVE Final    Comment:        The GeneXpert MRSA Assay (FDA approved for NASAL specimens only), is one component of a comprehensive MRSA colonization surveillance program. It is not intended to diagnose MRSA infection nor to  guide or monitor treatment for MRSA infections.   Culture, blood (routine x 2)     Status: None (Preliminary result)   Collection Time: 08/07/15  1:55 PM  Result Value Ref Range Status   Specimen Description BLOOD LEFT HAND  Final   Special Requests BOTTLES DRAWN AEROBIC AND ANAEROBIC 5CC  Final   Culture NO GROWTH 3 DAYS  Final   Report Status PENDING  Incomplete  Culture, blood (routine x 2)     Status: None (Preliminary result)   Collection Time: 08/07/15  2:07 PM  Result Value Ref Range Status   Specimen Description BLOOD LEFT ANTECUBITAL  Final   Special Requests IN PEDIATRIC BOTTLE 2CC  Final   Culture NO GROWTH 3 DAYS  Final   Report Status PENDING  Incomplete  AFB culture with smear     Status: None (Preliminary result)   Collection Time: 08/09/15  8:30 AM  Result Value Ref Range Status   Specimen Description BRONCHIAL ALVEOLAR LAVAGE  Final   Special Requests Normal  Final   Acid Fast Smear   Final    NO ACID FAST BACILLI SEEN Performed at [facility]    Culture   Final    CULTURE WILL BE EXAMINED FOR 6 WEEKS BEFORE ISSUING A FINAL REPORT Performed at [facility]    Report Status PENDING  Incomplete  Culture, bal-quantitative     Status: None (Preliminary result)   Collection Time: 08/09/15  8:30 AM  Result Value Ref Range Status   Specimen Description BRONCHIAL ALVEOLAR LAVAGE  Final   Special Requests Normal   Final   Gram Stain   Final    FEW WBC PRESENT,BOTH PMN AND MONONUCLEAR NO SQUAMOUS EPITHELIAL CELLS SEEN NO ORGANISMS SEEN Performed at [facility]   Final    4,000 COLONIES/ML Performed at [facility]    Culture   Final    Non-Pathogenic Oropharyngeal-type Flora Isolated. Performed at [facility]    Report Status PENDING  Incomplete  Fungus Culture with Smear     Status: None (Preliminary result)   Collection Time: 08/09/15  8:30 AM  Result Value Ref Range Status   Specimen Description BRONCHIAL ALVEOLAR LAVAGE  Final   Special Requests Normal  Final   Fungal Smear   Final    NO YEAST OR FUNGAL ELEMENTS SEEN Performed at [facility]    Culture   Final    CULTURE IN PROGRESS FOR FOUR WEEKS Performed at [facility]    Report Status PENDING  Incomplete     Labs: Basic Metabolic Panel:  Recent Labs Lab 08/06/15 0323 08/07/15 0710 08/08/15 0400 08/09/15 0411 08/10/15 0428  NA 133* 133* 136 135 135  K 3.8 3.8 3.8 3.8 3.7  CL 101 99* 106 101 102  CO2 24 25 21* 23 22  GLUCOSE 129* 132* 124* 221* 240*  BUN 27* 17 12 20  36*  CREATININE 1.96* 1.92* 1.76* 1.71* 1.84*  CALCIUM 8.4* 8.7* 8.7* 8.8* 8.8*   Liver Function Tests: No results for input(s): AST, ALT, ALKPHOS, BILITOT, PROT, ALBUMIN in the last 168 hours. No results for input(s): LIPASE, AMYLASE in the last 168 hours. No results for input(s): AMMONIA in the last 168 hours. CBC:  Recent Labs Lab 08/06/15 0323 08/07/15 1407 08/08/15 0400 08/09/15 0411 08/10/15 0428  WBC 10.9* 13.0* 9.7 7.4 12.8*  NEUTROABS  --   --   --  7.0 12.3*  HGB  10.4* 10.5* 9.7* 10.6* 10.0*  HCT 29.9* 29.1* 27.7* 30.1* 28.5*  MCV 95.8 93.6 94.2 94.1 94.4  PLT 238 284 290 320 334   Cardiac Enzymes: No results for input(s): CKTOTAL, CKMB, CKMBINDEX, TROPONINI in the last 168 hours. BNP: BNP (last 3 results) No results for input(s): BNP in the last 8760  hours.  ProBNP (last 3 results) No results for input(s): PROBNP in the last 8760 hours.  CBG:  Recent Labs Lab 08/10/15 0629 08/10/15 1126 08/10/15 1614 08/10/15 2153 08/11/15 0559  GLUCAP 202* 202* 239* 122* 156*    Signed:  Velvet Bathe  [facility] 08/11/2015, 11:59 AM

## 2015-08-11 NOTE — Care Management Note (Addendum)
Case Management Note  Patient Details  Name: Kenneth [name] MRN: [id number] Date of Birth: 08-19-1936  Subjective/Objective:      Adm w kidney injury, hypotension              Action/Plan: lives w fam, pcp dr vyas.  Pt is already on home O2 continuous. CM will continue to monitor for disposition needs.    Expected Discharge Date:                  Expected Discharge Plan:  [place]  In-House Referral:     Discharge planning Services     Post Acute Care Choice:    Choice offered to:     DME Arranged:    DME Agency:     HH Arranged:    [name] Agency:     Status of Service:  Complete, will sign off  Medicare Important Message Given:  Yes-second notification given Date Medicare IM Given:    Medicare IM give by:    Date Additional Medicare IM Given:    Additional Medicare Important Message give by:     If discussed at [place] of Stay Meetings, dates discussed:  08/11/15  Disposition: Home with self care  Additional Comments: ur review done 08/11/2015 :  Pt will discharge home today. CM contacted [facility], agency actively following pt anticipating discharge. CM faxed ambulation qualification note to [name] at [place] per request.  CM spoke with Rodena Piety at [place], fax received, No other CM needs. Wife is bringing portable oxygen tank to hospital preparing for discharge.  08/10/15 Pt had bronchoscopy 08/09/15, pt remains on IV steroids and IV antibiotics.  CM communicated with bedside nurse need for ambulation note required for qualification for home 02.  08/09/15 CM contacted by [name] at [place], pt will use agency upon discharge for home O2.  [name] requested ambulating note prior to discharge, agency already has home O2 order.  Ambulation note requested during progression rounds.   CM will continue to follow for disposition needs  08/05/15: CM assessed pt, pt stated he does have portable oxygen tank in his car located in hospital parking with enough oxygen for  transport home. Maryclare Labrador, RN 08/11/2015, 11:58 AM

## 2015-08-11 NOTE — Progress Notes (Signed)
Occupational Therapy Treatment Patient Details Name: Kenneth [name] MRN: [id number] DOB: 1936-07-17 Today's Date: 08/11/2015    History of present illness 79 y.o. male with a past medical history of AAA, CAD, hypertension, lung cancer (status post partial left upper lobe resection), COPD, type 2 diabetes, hyperlipidemia who is being transferred from [facility] due to acute kidney injury. The patient had been treated there for a week for bilateral pneumonia   OT comments  Pt progressing with all adls and energy conservation techniques.  Energy conservation handout provided to recall what he could change at home to make things easier on his breathing.  All goals met.  Pt has no further acute OT needs.  Follow Up Recommendations  No OT follow up    Equipment Recommendations  None recommended by OT    Recommendations for Other Services      Precautions / Restrictions Precautions Precautions: None Precaution Comments: watch HR and O2 sats Restrictions Weight Bearing Restrictions: No       Mobility Bed Mobility Overal bed mobility: Modified Independent             General bed mobility comments: No physical assist needd.  Transfers Overall transfer level: Modified independent   Transfers: Sit to/from [facility] Sit to Stand: Modified independent (Device/Increase time) Stand pivot transfers: Modified independent (Device/Increase time)            Balance Overall balance assessment: No apparent balance deficits (not formally assessed)                                 ADL Overall ADL's : Modified independent Eating/Feeding: Independent   Grooming: Wash/dry hands;Wash/dry face;Oral care;Modified independent;Standing               Lower Body Dressing: Modified independent;Sit to/from stand   Toilet Transfer: [place] and Hygiene: Modified independent;Sit to/from  stand   Tub/ Banker: Walk-in shower;Modified independent;Ambulation   Functional mobility during ADLs: Modified independent General ADL Comments: Energy conservation handout provided.      Vision                     Perception     Praxis      Cognition   Behavior During Therapy: WFL for tasks assessed/performed Overall Cognitive Status: Within Functional Limits for tasks assessed                       Extremity/Trunk Assessment               Exercises     Shoulder Instructions       General Comments      Pertinent Vitals/ Pain       Pain Assessment: No/denies pain Pain Score: 0-No pain  Home Living                                          Prior Functioning/Environment              Frequency Min 2X/week     Progress Toward Goals  OT Goals(current goals can now be found in the care plan section)  Progress towards OT goals: Goals met/education completed, patient discharged from OT  Acute Rehab OT Goals Patient Stated Goal: not stated OT  Goal Formulation: With patient Time For Goal Achievement: 08/11/15 Potential to Achieve Goals: Good ADL Goals Pt Will Perform Grooming: with modified independence;standing Pt Will Transfer to Toilet: with modified independence;regular height toilet;ambulating Pt Will Perform Toileting - Clothing Manipulation and hygiene: with modified independence;sit to/from stand Pt Will Perform Tub/Shower Transfer: with modified independence;ambulating  Plan Discharge plan remains appropriate    Co-evaluation                 End of Session Equipment Utilized During Treatment: Oxygen   Activity Tolerance Other (comment) (cont to have SOB)   Patient Left in chair;with call bell/phone within reach   Nurse Communication Mobility status;Other (comment)        Time: 1438-8875 OT Time Calculation (min): 14 min  Charges: OT General Charges $OT Visit: 1 Procedure OT  Treatments $Self Care/Home Management : 8-22 mins  Glenford Peers 08/11/2015, 12:04 PM  [phone]

## 2015-08-11 NOTE — Progress Notes (Signed)
Inpatient Diabetes Program Recommendations  AACE/ADA: New Consensus Statement on Inpatient Glycemic Control (2015)  Target Ranges:  Prepandial:   less than 140 mg/dL      Peak postprandial:   less than 180 mg/dL (1-2 hours)      Critically ill patients:  140 - 180 mg/dL   Review of Glycemic Control  Results for Kenneth [name], Kenneth [name] (MRN [id number]) as of 08/11/2015 11:31  Ref. Range 08/10/2015 06:29 08/10/2015 11:26 08/10/2015 16:14 08/10/2015 21:53 08/11/2015 05:59  Glucose-Capillary Latest Ref Range: 65-99 mg/dL 202 (H) 202 (H) 239 (H) 122 (H) 156 (H)   Post-prandial blood sugars elevated.  Inpatient Diabetes Program Recommendations:  Insulin - Meal Coverage: Consider addition of meal coverage insulin - Novolog 3 units tidwc if pt eats > 50% meal   Will continue to follow. Thank you. Lorenda Peck, RD, LDN, CDE Inpatient Diabetes Coordinator [phone]

## 2015-08-11 NOTE — Discharge Instructions (Signed)
Please take your glipizide while you are on steroids.  Follow up with your pcp for further instructions and work up for your hyperglycemia

## 2015-08-11 NOTE — Progress Notes (Signed)
SATURATION QUALIFICATIONS: (This note is used to comply with regulatory documentation for home oxygen)  Patient Saturations on Room Air at Rest = 91%  Patient Saturations on Room Air while Ambulating = 87%  Patient Saturations on 2 Liters of oxygen while Ambulating = 95%  Please briefly explain why patient needs home oxygen: pt at 91% RA drop to 89% sitting and dropped to 87% ambulating with 3 steps. Pt put on 2 L oxygen and O2 went up to 95%.

## 2015-08-12 LAB — CULTURE, BLOOD (ROUTINE X 2)
CULTURE: NO GROWTH
Culture: NO GROWTH

## 2015-08-13 LAB — CULTURE, BAL-QUANTITATIVE: SPECIAL REQUESTS: NORMAL

## 2015-08-13 LAB — CULTURE, BAL-QUANTITATIVE W GRAM STAIN: Colony Count: 4000

## 2015-08-15 ENCOUNTER — Encounter: Payer: Self-pay | Admitting: Cardiology

## 2015-08-15 ENCOUNTER — Telehealth: Payer: Self-pay | Admitting: Cardiology

## 2015-08-15 NOTE — Telephone Encounter (Signed)
Pt wife called and wanted to know if pt home monitor was working properly. Pt wife informed me that at this current moment pt is seeing Dr. Wynonia Lawman but she is not happy with this. She wanted to know if she could change MD's. I informed her that she could do that if that is what she wanted to do and that she'd probably just need an appt and get records from current cardiologist. Gave her numbers to both [employer]. She expressed that she wanted pt to see Dr. Domenic Polite. Informed her that Dr. Domenic Polite would only do primary cardiologist that pt would need to have implanted device followed by another MD like Dr. Caryl Comes. Pt wife stated that they will continue to f/u w/ [facility] for device in [place] office. Pt wife aware to contact Dr. Wynonia Lawman office to see if home monitor is working properly. She verbalized understanding.

## 2015-08-29 ENCOUNTER — Ambulatory Visit (INDEPENDENT_AMBULATORY_CARE_PROVIDER_SITE_OTHER)
Admission: RE | Admit: 2015-08-29 | Discharge: 2015-08-29 | Disposition: A | Discharge status: Home / Self Care | Payer: Medicare HMO | Source: Ambulatory Visit | Attending: Pulmonary Disease | Admitting: Pulmonary Disease

## 2015-08-29 ENCOUNTER — Encounter: Payer: Self-pay | Admitting: Pulmonary Disease

## 2015-08-29 ENCOUNTER — Ambulatory Visit (INDEPENDENT_AMBULATORY_CARE_PROVIDER_SITE_OTHER): Payer: Medicare HMO | Admitting: Pulmonary Disease

## 2015-08-29 VITALS — BP 110/70 | HR 73 | Ht 67.0 in | Wt 148.6 lb

## 2015-08-29 DIAGNOSIS — J449 Chronic obstructive pulmonary disease, unspecified: Secondary | ICD-10-CM

## 2015-08-29 DIAGNOSIS — I119 Hypertensive heart disease without heart failure: Secondary | ICD-10-CM | POA: Diagnosis not present

## 2015-08-29 DIAGNOSIS — J849 Interstitial pulmonary disease, unspecified: Secondary | ICD-10-CM | POA: Diagnosis not present

## 2015-08-29 NOTE — Patient Instructions (Addendum)
Oxygen level looks good  - Ok to stay off oxygen CXR today Schedule breathing tests

## 2015-08-29 NOTE — Assessment & Plan Note (Signed)
Hold bystolic if SBP < 528 again He will monitor bP next 2 days given episode of hypotension

## 2015-08-29 NOTE — Progress Notes (Signed)
   Subjective:    Patient ID: Kenneth [name], male    DOB: 12/13/1935, 79 y.o.   MRN: [id number]  HPI 79yo male heavy ex smoker, quit 2007 for FU of  Persistent bilateral pulmonary infiltrates - evidence of ILD R>L dating back to 2014. PMH CAD s/p CABG and AVR 2011, HTN, COPD, hx NSCLC s/p LULobectomy 2007, DM   He was admitted to [facility] with PNA. Treated with abx but had no improvement, Vancomycin stopped r/t AKI and pt was tx to [facility] 08/02/15 for further treatment. Despite 2 weeks abx he still shows no significant improvement symptomatically or radiographically with bilat R>L pulmonary infiltrates and PCCM consulted.    No known occupational or environmental exposure. Pets - 2 dogs. Quit smoking 2007. H/o LUlobectomy ([place]) 2007. Review shows chronic infx dating back to 2014    08/29/2015  Chief Complaint  Patient presents with  . Hospitalization Follow-up    pt states he is ok. pt states today his blood pressure dropped and wasnt feeling to good. pt breathing is good. no c/o SOB, chest tightness. pt states he has a little cough on and off. pt on continuous O2 at 2LPM.    Tapered off prednisone within 2 weeks C/o dyspnea stable, dry cough No joint symptoms Using O2 BP was running low today, improved spont - takes bystolic & 40 mg lasix daily Chest x-ray continues to show bilateral upper lobe infiltrates He does not desaturate on walking on room air today   STUDIES:  CT chest 9/25>>> Progressive opacities in the right upper lobe, left upper lobe  left lower lobe concerning for worsening multifocal pneumonia. Mild COPD.  PFTs in 2009 showed a ratio of 50, DLCO of 69% and preserved lung volumes and flows.  12/2005 path >> NON-NEOPLASTIC LUNG PARENCHYMA WITH FIBROSIS, CHRONIC INFLAMMATION AND ANTHRACOTIC PIGMENT  07/2015 TBBX non diagnostic, cultures neg ANA neg, RA pos, but CCP neg, ESR > 140   Review of Systems neg for any significant sore throat,  dysphagia, itching, sneezing, nasal congestion or excess/ purulent secretions, fever, chills, sweats, unintended wt loss, pleuritic or exertional cp, hempoptysis, orthopnea pnd or change in chronic leg swelling. Also denies presyncope, palpitations, heartburn, abdominal pain, nausea, vomiting, diarrhea or change in bowel or urinary habits, dysuria,hematuria, rash, arthralgias, visual complaints, headache, numbness weakness or ataxia.     Objective:   Physical Exam  Gen. Pleasant, well-nourished, in no distress ENT - no lesions, no post nasal drip Neck: No JVD, no thyromegaly, no carotid bruits Lungs: no use of accessory muscles, no dullness to percussion, clear without rales or rhonchi  Cardiovascular: Rhythm regular, heart sounds  normal, no murmurs or gallops, no peripheral edema Musculoskeletal: No deformities, no cyanosis or clubbing         Assessment & Plan:

## 2015-08-30 NOTE — Assessment & Plan Note (Signed)
He will continue albuterol Will add Spiriva based on PFTs

## 2015-08-30 NOTE — Assessment & Plan Note (Signed)
Not clear that his fibrosis is progressive-he does seem to have had an acute illness requiring oxygen which seems to have improved now. As such, okay to stay off steroids. He ESR was high with other inflammatory markers negative.  We will obtain PFTs to quantitate lung function and repeat CT scan in the future.

## 2015-09-01 ENCOUNTER — Telehealth: Payer: Self-pay | Admitting: Pulmonary Disease

## 2015-09-01 NOTE — Telephone Encounter (Signed)
Notes Recorded by [name], MD on 08/29/2015 at 5:17 PM Scarring in both upper lobes is noted as prior-unchanged from 2 weeks ago. If his symptoms get worse, he can call for more prednisone ----------------------------------------  Spoke with pt, aware of results/recs.  States he is feeling well today.  Nothing further needed.

## 2015-09-07 LAB — FUNGUS CULTURE W SMEAR
FUNGAL SMEAR: NONE SEEN
Special Requests: NORMAL

## 2015-09-21 LAB — AFB CULTURE WITH SMEAR (NOT AT ARMC)
ACID FAST SMEAR: NONE SEEN
Special Requests: NORMAL

## 2015-10-26 ENCOUNTER — Encounter: Payer: Medicare HMO | Admitting: Internal Medicine

## 2015-10-31 ENCOUNTER — Ambulatory Visit (INDEPENDENT_AMBULATORY_CARE_PROVIDER_SITE_OTHER): Payer: Medicare HMO | Admitting: Adult Health

## 2015-10-31 ENCOUNTER — Encounter: Payer: Self-pay | Admitting: Adult Health

## 2015-10-31 ENCOUNTER — Telehealth: Payer: Self-pay | Admitting: Pulmonary Disease

## 2015-10-31 ENCOUNTER — Ambulatory Visit (INDEPENDENT_AMBULATORY_CARE_PROVIDER_SITE_OTHER): Payer: Medicare HMO | Admitting: Pulmonary Disease

## 2015-10-31 VITALS — BP 134/86 | HR 102 | Temp 97.7°F | Ht 67.0 in | Wt 155.0 lb

## 2015-10-31 DIAGNOSIS — J9610 Chronic respiratory failure, unspecified whether with hypoxia or hypercapnia: Secondary | ICD-10-CM | POA: Insufficient documentation

## 2015-10-31 DIAGNOSIS — J449 Chronic obstructive pulmonary disease, unspecified: Secondary | ICD-10-CM

## 2015-10-31 DIAGNOSIS — J849 Interstitial pulmonary disease, unspecified: Secondary | ICD-10-CM

## 2015-10-31 DIAGNOSIS — J9611 Chronic respiratory failure with hypoxia: Secondary | ICD-10-CM | POA: Diagnosis not present

## 2015-10-31 LAB — PULMONARY FUNCTION TEST
DL/VA % PRED: 51 %
DL/VA: 2.25 ml/min/mmHg/L
DLCO unc % pred: 39 %
DLCO unc: 11.26 ml/min/mmHg
FEF 25-75 Post: 0.84 L/sec
FEF 25-75 Pre: 0.75 L/sec
FEF2575-%CHANGE-POST: 12 %
FEF2575-%PRED-PRE: 40 %
FEF2575-%Pred-Post: 45 %
FEV1-%CHANGE-POST: 4 %
FEV1-%PRED-POST: 69 %
FEV1-%Pred-Pre: 66 %
FEV1-PRE: 1.72 L
FEV1-Post: 1.81 L
FEV1FVC-%CHANGE-POST: 2 %
FEV1FVC-%Pred-Pre: 73 %
FEV6-%Change-Post: 4 %
FEV6-%PRED-PRE: 91 %
FEV6-%Pred-Post: 94 %
FEV6-PRE: 3.1 L
FEV6-Post: 3.23 L
FEV6FVC-%Change-Post: 1 %
FEV6FVC-%PRED-PRE: 102 %
FEV6FVC-%Pred-Post: 104 %
FVC-%CHANGE-POST: 2 %
FVC-%PRED-PRE: 88 %
FVC-%Pred-Post: 90 %
FVC-POST: 3.32 L
FVC-PRE: 3.24 L
POST FEV6/FVC RATIO: 97 %
PRE FEV1/FVC RATIO: 53 %
Post FEV1/FVC ratio: 54 %
Pre FEV6/FVC Ratio: 96 %
RV % pred: 110 %
RV: 2.68 L
TLC % pred: 103 %
TLC: 6.66 L

## 2015-10-31 MED ORDER — PREDNISONE 10 MG PO TABS: ORAL_TABLET | ORAL | Status: DC

## 2015-10-31 MED ORDER — TIOTROPIUM BROMIDE MONOHYDRATE 18 MCG IN CAPS: 18.0000 ug | ORAL_CAPSULE | Freq: Every day | RESPIRATORY_TRACT | Status: DC

## 2015-10-31 NOTE — Assessment & Plan Note (Signed)
Fibrotic changes since 2007 , most recent CT chest  07/2015 with progressive changes PFT shows decline in lung fxn with worsening Diffusing defect.  Seems to respond to steroids  Will have him slowly taper prednisone and hold at 10mg  until seen back in office  Consider repeat CT chest in  March . ? Antifibrotic candidate.

## 2015-10-31 NOTE — Progress Notes (Signed)
PFT done today. 

## 2015-10-31 NOTE — Progress Notes (Signed)
Subjective:    Patient ID: Kenneth [name], male    DOB: 1936-07-04, 79 y.o.   MRN: [id number]  HPI 79yo male heavy ex smoker, quit 2007 for FU of  Persistent bilateral pulmonary infiltrates - evidence of ILD R>L dating back to 2014. Hx of squamous cell carcinoma H/o LUlobectomy ([place]) 2007.  No known occupational or environmental exposure. Pets - 2 dogs. Quit smoking 2007. H/o LUlobectomy ([place]) 2007. Review shows chronic infx dating back to 2014    10/31/2015 Follow up ; COPD and ILD  Patient returns for a two-month follow-up Patient had a PFT today that showed a post bronchodilator, FEV1  at 69%, ratio 54, no significant bronchodilator response, FVC 90%, DLCO 39%.. This is decreased from 2009 .  CT chest in September 2016 showed progressive opacities of the right upper lobe, left upper lobe, left lower lobe. Had tapered off steroids last visit but has had to restart. Currently on prednisone '20mg'$  daily  .  Seen by PCP with rx for abx x 3 since last seen . Says as soon as he gets off abx and steroids  Cough gets worse.  He remains on BREO . He is on oxygen at bedtime.Marland Kitchen He denies any hemoptysis, orthopnea, PND, or increased leg swelling.   STUDIES:  CT chest 9/25>>> Progressive opacities in the right upper lobe, left upper lobe  left lower lobe concerning for worsening multifocal pneumonia. Mild COPD.  PFTs in 2009 showed a ratio of 50, DLCO of 69% and preserved lung volumes and flows.  12/2005 path >> INVASIVE MODERATELY DIFFERENTIATED SQUAMOUS CELL CARCINOMA. NON-NEOPLASTIC LUNG PARENCHYMA WITH FIBROSIS, CHRONIC INFLAMMATION AND ANTHRACOTIC PIGMENT  07/2015 TBBX non diagnostic, cultures neg ANA neg, RA pos, but CCP neg, ESR > 140   Past Medical History  Diagnosis Date  . Abdominal aortic aneurysm without mention of rupture   . Kidney stones 05/03/09    HEMATURIA,CYSTOSCOPY DONE  . CAD (coronary artery disease), native coronary artery 03/10/2014    02/28/10 AVR  with tissue valve, Cabg with LIMA to LAD, SVG to int, and SVG to RCA Dr. Roxan Hockey  Cath 02/16/2010 Calcified coronaries  LM calcified, LAD, diffuse disease, circ calcified with 60% mid, RCA calcified with 80% prox and 50% mid  36 mm Peak gradient, severe aortic stenosis  2003 Cypher stent in distal RCA , PTCA of diagonal 1996 PTCA of RCA in 2 places with restenosis treated again   . Hypertensive heart disease   . Diabetes mellitus type 2, noninsulin dependent ([place]) 03/10/2014  . COPD   . Chronic dyspnea   . Chronic diastolic heart failure ([place])   . Lumbar disc disease   . Hyperlipidemia   . History of lung cancer 12/13/2005    LUL lobe Stage 1 non small cell cancer treated with resection Dr. [name]   . GERD 08/06/2008  . Atrial fibrillation ([place])   . Cancer ([place]) 2007    Lung  . Hypertension   . LBBB (left bundle branch block)   . AICD (automatic cardioverter/defibrillator) present 04/13/2015    icd   . CHF (congestive heart failure) ([place])     Current Outpatient Prescriptions on File Prior to Visit  Medication Sig Dispense Refill  . albuterol (PROVENTIL) (2.5 MG/3ML) 0.083% nebulizer solution Take 2.5 mg by nebulization every 6 (six) hours as needed for wheezing or shortness of breath.     Marland Kitchen aspirin 81 MG tablet Take 81 mg by mouth daily.     . finasteride (PROSCAR) 5  MG tablet Take 1 tablet by mouth every morning.    . furosemide (LASIX) 40 MG tablet Take 40 mg by mouth daily.     . nebivolol (BYSTOLIC) 5 MG tablet Take 5 mg by mouth daily.    Marland Kitchen omeprazole (PRILOSEC) 40 MG capsule Take 40 mg by mouth daily.     . potassium chloride (K-DUR,KLOR-CON) 10 MEQ tablet Take 10 mEq by mouth every other day.      No current facility-administered medications on file prior to visit.     Review of Systems neg for any significant sore throat, dysphagia, itching, sneezing, nasal congestion or excess/ purulent secretions, fever, chills, sweats, unintended wt loss, pleuritic or exertional cp,  hempoptysis, orthopnea pnd or change in chronic leg swelling. Also denies presyncope, palpitations, heartburn, abdominal pain, nausea, vomiting, diarrhea or change in bowel or urinary habits, dysuria,hematuria, rash, arthralgias, visual complaints, headache, numbness weakness or ataxia.     Objective:   Physical Exam Filed Vitals:   10/31/15 1007  BP: 134/86  Pulse: 102  Temp: 97.7 F (36.5 C)  TempSrc: Oral  Height: '5\' 7"'$  (1.702 m)  Weight: 155 lb (70.308 kg)  SpO2: 93%    Gen. Pleasant, elderly and chronically ill appearing ENT - no lesions, no post nasal drip Neck: No JVD, no thyromegaly, no carotid bruits Lungs: no use of accessory muscles, no dullness to percussion, few scattered rhonchi Cardiovascular: Rhythm regular, heart sounds  normal, no murmurs or gallops, tr peripheral edema Musculoskeletal: No deformities, no cyanosis or clubbing         Assessment & Plan:

## 2015-10-31 NOTE — Assessment & Plan Note (Signed)
COPD complicated by underlying pulmonary fibrosis .  PFT does show a decline in lung function compared to 2009 .   Plan  Begin Spiriva 1 puff daily  Taper Prednisone 20mg  daily for 2 weeks , 15mg  daily for 2 weeks and 10mg  daily -hold at this dose.  Follow up Dr. Elsworth Soho  In 6 weeks and As needed   Please contact office for sooner follow up if symptoms do not improve or worsen or seek emergency care

## 2015-10-31 NOTE — Patient Instructions (Signed)
Begin Spiriva 1 puff daily  Taper Prednisone 20mg  daily for 2 weeks , 15mg  daily for 2 weeks and 10mg  daily -hold at this dose.  Follow up Dr. Elsworth Soho  In 6 weeks and As needed   Please contact office for sooner follow up if symptoms do not improve or worsen or seek emergency care

## 2015-10-31 NOTE — Assessment & Plan Note (Signed)
Cont on O2 with At bedtime   May use with act if needed.

## 2015-10-31 NOTE — Telephone Encounter (Signed)
Called and spoke to pt. Pt was seen by TP on 10/31/2015. Pt is needing an appt in 6 weeks per TP but there are no available openings until the end of march.   Dr. Elsworth Soho please advise if ok to book at your first available or double book your schedule. Thanks.

## 2015-10-31 NOTE — Addendum Note (Signed)
Addended by: Osa Craver on: 10/31/2015 11:45 AM   Modules accepted: Orders

## 2015-11-14 NOTE — Telephone Encounter (Signed)
Place patient on call list- I with open additional office times 

## 2015-11-15 NOTE — Progress Notes (Signed)
Reviewed & agree with plan  

## 2015-11-15 NOTE — Telephone Encounter (Signed)
Placed on call list, will contact patient once schedule has opened. Nothing further needed.

## 2015-11-25 ENCOUNTER — Encounter: Payer: Self-pay | Admitting: Family

## 2015-11-29 ENCOUNTER — Ambulatory Visit (HOSPITAL_COMMUNITY)
Admission: RE | Admit: 2015-11-29 | Discharge: 2015-11-29 | Disposition: A | Discharge status: Home / Self Care | Payer: Medicare HMO | Source: Ambulatory Visit | Attending: Family | Admitting: Family

## 2015-11-29 ENCOUNTER — Encounter: Payer: Self-pay | Admitting: Family

## 2015-11-29 ENCOUNTER — Ambulatory Visit (INDEPENDENT_AMBULATORY_CARE_PROVIDER_SITE_OTHER): Payer: Medicare HMO | Admitting: Family

## 2015-11-29 VITALS — BP 133/85 | HR 105 | Ht 67.0 in | Wt 154.1 lb

## 2015-11-29 DIAGNOSIS — I723 Aneurysm of iliac artery: Secondary | ICD-10-CM | POA: Insufficient documentation

## 2015-11-29 DIAGNOSIS — I714 Abdominal aortic aneurysm, without rupture, unspecified: Secondary | ICD-10-CM

## 2015-11-29 DIAGNOSIS — Z87891 Personal history of nicotine dependence: Secondary | ICD-10-CM

## 2015-11-29 NOTE — Patient Instructions (Signed)
Abdominal Aortic Aneurysm An aneurysm is a weakened or damaged part of an artery wall that bulges from the normal force of blood pumping through the body. An abdominal aortic aneurysm is an aneurysm that occurs in the lower part of the aorta, the main artery of the body.  The major concern with an abdominal aortic aneurysm is that it can enlarge and burst (rupture) or blood can flow between the layers of the wall of the aorta through a tear (aorticdissection). Both of these conditions can cause bleeding inside the body and can be life threatening unless diagnosed and treated promptly. CAUSES  The exact cause of an abdominal aortic aneurysm is unknown. Some contributing factors are:   A hardening of the arteries caused by the buildup of fat and other substances in the lining of a blood vessel (arteriosclerosis).  Inflammation of the walls of an artery (arteritis).   Connective tissue diseases, such as Marfan syndrome.   Abdominal trauma.   An infection, such as syphilis or staphylococcus, in the wall of the aorta (infectious aortitis) caused by bacteria. RISK FACTORS  Risk factors that contribute to an abdominal aortic aneurysm may include:  Age older than 60 years.   High blood pressure (hypertension).  Male gender.  Ethnicity (white race).  Obesity.  Family history of aneurysm (first degree relatives only).  Tobacco use. PREVENTION  The following healthy lifestyle habits may help decrease your risk of abdominal aortic aneurysm:  Quitting smoking. Smoking can raise your blood pressure and cause arteriosclerosis.  Limiting or avoiding alcohol.  Keeping your blood pressure, blood sugar level, and cholesterol levels within normal limits.  Decreasing your salt intake. In somepeople, too much salt can raise blood pressure and increase your risk of abdominal aortic aneurysm.  Eating a diet low in saturated fats and cholesterol.  Increasing your fiber intake by including  whole grains, vegetables, and fruits in your diet. Eating these foods may help lower blood pressure.  Maintaining a healthy weight.  Staying physically active and exercising regularly. SYMPTOMS  The symptoms of abdominal aortic aneurysm may vary depending on the size and rate of growth of the aneurysm.Most grow slowly and do not have any symptoms. When symptoms do occur, they may include:  Pain (abdomen, side, lower back, or groin). The pain may vary in intensity. A sudden onset of severe pain may indicate that the aneurysm has ruptured.  Feeling full after eating only small amounts of food.  Nausea or vomiting or both.  Feeling a pulsating lump in the abdomen.  Feeling faint or passing out. DIAGNOSIS  Since most unruptured abdominal aortic aneurysms have no symptoms, they are often discovered during diagnostic exams for other conditions. An aneurysm may be found during the following procedures:  Ultrasonography (A one-time screening for abdominal aortic aneurysm by ultrasonography is also recommended for all men aged 65-75 years who have ever smoked).  X-ray exams.  A computed tomography (CT).  Magnetic resonance imaging (MRI).  Angiography or arteriography. TREATMENT  Treatment of an abdominal aortic aneurysm depends on the size of your aneurysm, your age, and risk factors for rupture. Medication to control blood pressure and pain may be used to manage aneurysms smaller than 6 cm. Regular monitoring for enlargement may be recommended by your caregiver if:  The aneurysm is 3-4 cm in size (an annual ultrasonography may be recommended).  The aneurysm is 4-4.5 cm in size (an ultrasonography every 6 months may be recommended).  The aneurysm is larger than 4.5 cm in   size (your caregiver may ask that you be examined by a vascular surgeon). If your aneurysm is larger than 6 cm, surgical repair may be recommended. There are two main methods for repair of an aneurysm:   Endovascular  repair (a minimally invasive surgery). This is done most often.  Open repair. This method is used if an endovascular repair is not possible.   This information is not intended to replace advice given to you by your health care provider. Make sure you discuss any questions you have with your health care provider.   Document Released: 08/08/2005 Document Revised: 02/23/2013 Document Reviewed: 11/28/2012 Elsevier Interactive Patient Education 2016 [employer]  

## 2015-11-29 NOTE — Progress Notes (Signed)
[facility] [place]  Established Abdominal Aortic Aneurysm  History of Present Illness  Kenneth [name] is a 80 y.o. (1936/10/23) male patient of Dr. Donnetta Hutching who presents with chief complaint: follow up for AAA. Previous studies demonstrate an AAA, measuring 4.72 cm. The patient does not have back or abdominal pain. The patient is a former smoker.  Patient states his blood pressure is usually 128-138/70-80; he states that he takes his antihypertensive medication if his blood pressure gets above 140/90, he states as advised by Dr. Wynonia Lawman.  The patient denies claudication in legs with walking, denies a history of stroke or TIA symptoms.   He had a 3 vessel CABG and aortic valve replacement in 2011, his cardiologist is Dr. Wynonia Lawman.  Crestor caused myalgias, is taking Red Yeast Rice instead.  He is taking a daily ASA and a beta blocker.  He rarely uses ETOH.  He is physically active, is dyspneic with moderate exertion.  His brother had an AAA in addition to ESRD, but died with his first dialysis session.   He had a pacemaker/defibrillator inserted in June 2016 for low EF; pt states he feels no improvement in energy.  Pt Diabetic: No, on prednisone intermittently for for about 4 months, for COPD breathing issues. His blood sugar has increased due to this, he takes rapid acting insulin and checks his blood sugar  Pt smoker: former smoker, quit in 2007  Dr. Donnetta Hutching last saw patient on 05/24/15. At that time duplex ultrasound revealed no change from 6 months prior. Maximal diameter was 5.1 cm which was the same reading as October 2015. Stable 5.1 cm aneurysm with no growth. Dr. Donnetta Hutching explained to pt that his annual risk for rupture would be significantly less than 5% and therefore would recommend continued observation. CT scan from 2015 showed that he would be a stent graft candidate from his current anatomy.    Past Medical History  Diagnosis Date  . Abdominal aortic  aneurysm without mention of rupture   . Kidney stones 05/03/09    HEMATURIA,CYSTOSCOPY DONE  . CAD (coronary artery disease), native coronary artery 03/10/2014    02/28/10 AVR with tissue valve, Cabg with LIMA to LAD, SVG to int, and SVG to RCA Dr. Roxan Hockey  Cath 02/16/2010 Calcified coronaries  LM calcified, LAD, diffuse disease, circ calcified with 60% mid, RCA calcified with 80% prox and 50% mid  36 mm Peak gradient, severe aortic stenosis  2003 Cypher stent in distal RCA , PTCA of diagonal 1996 PTCA of RCA in 2 places with restenosis treated again   . Hypertensive heart disease   . Diabetes mellitus type 2, noninsulin dependent ([place]) 03/10/2014  . COPD   . Chronic dyspnea   . Chronic diastolic heart failure ([place])   . Lumbar disc disease   . Hyperlipidemia   . History of lung cancer 12/13/2005    LUL lobe Stage 1 non small cell cancer treated with resection Dr. [name]   . GERD 08/06/2008  . Atrial fibrillation ([place])   . Cancer ([place]) 2007    Lung  . Hypertension   . LBBB (left bundle branch block)   . AICD (automatic cardioverter/defibrillator) present 04/13/2015    icd   . CHF (congestive heart failure) [facility])    Past Surgical History  Procedure Laterality Date  . Coronary artery bypass graft  02/2010  . Tissue aortic valve replacement  02/2010  . Tonsillectomy    . Lul resection  12/2005    DR,  [place]  Lung Surgery  . Colonoscopy  2008    NORMAL  . Ep implantable device N/A 04/13/2015    Procedure: BiV ICD Insertion CRT-D;  Surgeon: [name], MD;  Location: [place] [facility];  Service: Cardiovascular;  Laterality: N/A;  . Cataract extraction Right   . Video bronchoscopy Bilateral 08/09/2015    Procedure: VIDEO BRONCHOSCOPY WITH FLUORO;  Surgeon: [name], MD;  Location: [place] [place];  Service: Cardiopulmonary;  Laterality: Bilateral;   Social History Social History   Social History  . Marital Status: Married    Spouse Name: N/A  . Number of Children: N/A  . Years of  Education: N/A   Occupational History  . lumber retail     Social History Main Topics  . Smoking status: Former Smoker -- 1.00 packs/day for 50 years    Types: Cigarettes    Quit date: 12/13/2005  . Smokeless tobacco: Never Used     Comment: remotely quit smoking  . Alcohol Use: No  . Drug Use: No  . Sexual Activity: Not on file   Other Topics Concern  . 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 Heart disease Father     MI/CABG  . Hyperlipidemia Father   . Hypertension Father   . Heart attack Father     Several attacks  . Diabetes Father   . Cancer Mother     Breast  . Dementia Mother   . Heart disease Brother     Before age 86  . Hyperlipidemia Brother   . Hypertension Brother   . Heart attack Brother   . Peripheral vascular disease Brother   . Diabetes Daughter     Current Outpatient Prescriptions on File Prior to Visit  Medication Sig Dispense Refill  . albuterol (PROVENTIL) (2.5 MG/3ML) 0.083% nebulizer solution Take 2.5 mg by nebulization every 6 (six) hours as needed for wheezing or shortness of breath.     Marland Kitchen aspirin 81 MG tablet Take 81 mg by mouth daily.     . finasteride (PROSCAR) 5 MG tablet Take 1 tablet by mouth every morning.    . Fluticasone Furoate-Vilanterol (BREO ELLIPTA) 200-25 MCG/INH AEPB Inhale 1 puff into the lungs daily.    . furosemide (LASIX) 40 MG tablet Take 40 mg by mouth daily.     . nebivolol (BYSTOLIC) 5 MG tablet Take 5 mg by mouth daily.    Marland Kitchen omeprazole (PRILOSEC) 40 MG capsule Take 40 mg by mouth daily.     . potassium chloride (K-DUR,KLOR-CON) 10 MEQ tablet Take 10 mEq by mouth every other day.     . predniSONE (DELTASONE) 20 MG tablet Take 20 mg by mouth daily with breakfast. Reported on 11/29/2015    . tiotropium (SPIRIVA) 18 MCG  inhalation capsule Place 1 capsule (18 mcg total) into inhaler and inhale daily. 30 capsule 5  . predniSONE (DELTASONE) 10 MG tablet Taper Prednisone 20mg  daily for 2 weeks , 15mg  daily for 2 weeks and 10mg  daily -hold at this dose. (Patient not taking: Reported on 11/29/2015) 60 tablet 0   No current facility-administered medications on file prior to visit.   Allergies  Allergen Reactions  . Ciprocin-Fluocin-Procin [Fluocinolone] Nausea Only  . Clarithromycin Nausea Only    Altered taste  . Hydrocodone-Acetaminophen  Nausea Only    ROS: See HPI for pertinent positives and negatives.  Physical Examination  Filed Vitals:   11/29/15 0828  BP: 133/85  Pulse: 105  Height: 5\' 7"  (1.702 m)  Weight: 154 lb 1.6 oz (69.899 kg)  SpO2: 95%   Body mass index is 24.13 kg/(m^2).  General: A&O x 3, WD.  Pulmonary: Sym exp, good air movt, CTAB, no rales, rhonchi, or wheezing. Chronic cough from COPD. Cardiac: RRR, Nl S1, S2, no detected murmur.   Carotid Bruits  Left  Right    Negative  Negative   Aorta is palpable  Radial pulses are 2+ palpable and =.   VASCULAR EXAM:  LE Pulses  LEFT  RIGHT   FEMORAL  2+palpable  2+palpable   POPLITEAL  not palpable  not palpable   POSTERIOR TIBIAL  Not palpable  Not palpable   DORSALIS PEDIS  ANTERIOR TIBIAL  1+palpable  1+palpable    Gastrointestinal: soft, NTND, -G/R, - HSM, - masses palpated, - CVAT B.  Musculoskeletal: M/S 5/5 throughout, Extremities without ischemic changes.  Neurologic: CN 2-12 intact, Pain and light touch intact in extremities are intact, Motor exam as listed above.        Non-Invasive Vascular Imaging  AAA Duplex (11/29/2015) ABDOMINAL AORTA DUPLEX EVALUATION    INDICATION: Follow-up abdominal aortic aneurysm     PREVIOUS INTERVENTION(S):     DUPLEX EXAM:     LOCATION DIAMETER AP (cm) DIAMETER TRANSVERSE (cm) VELOCITIES (cm/sec)  Aorta Proximal 2.2 2.5 93.9  Aorta Mid 2.1 2.1 43.7   Aorta Distal 5.2 5.1 28.1  Right Common Iliac Artery 1.7 1.7 123  Left Common Iliac Artery 1.1 1.2 251/304    Previous max aortic diameter:  5.10cm x 5.12cm Date: 05/24/2015     ADDITIONAL FINDINGS:     IMPRESSION: 1. Distal abdominal aortic aneurysm measuring 5.2 x 5.1 cm in the transverse plane. 2. Right proximal common iliac artery aneurysm measuring 1.7 x 1.7 cm in the transverse plane. 3. Elevated peak systolic velocities in the proximal left common iliac artery consistent with a >50% diameter reduction.    Compared to the previous exam:  No significant changes noted compared to previous exam.    CTA abdomen/pelvis: 09/07/2014: IMPRESSION: Infrarenal abdominal aortic aneurysm measuring up to 5.2 cm and previously measured up to 5.1 cm.  Atherosclerotic disease in the iliac arteries with stenosis at the right common iliac artery origin.  Stenosis in the proximal/mid left renal artery.  Bilateral renal cysts.  Sigmoid diverticulosis. There is not clear evidence for acute colonic inflammation but there may be trace fluid in the pelvis. Subtle diverticulitis cannot be excluded.   Electronically Signed  By: Markus Daft M.D.  On: 09/07/2014 12:12   Medical Decision Making  The patient is a 80 y.o. male who presents with asymptomatic AAA with no increase in size compared to CTA of abdomen/pelvis on 09/07/14. Maximum diameter by duplex  today of 5.2 cm, and maximum diameter on CTA abdomen/pelvis of 09/07/14 was 5.2 cm.  Bilateral femoral pulses are 2+ palpable. Pt has no claudication sx's with walking.    Based on this patient's exam and diagnostic studies, and after discussing with Dr. Donnetta Hutching, the patient will follow up in 6 months  with the following studies: AAA duplex.  Consideration for repair of AAA would be made when the size is 5.5 cm, growth > 1 cm/yr, and symptomatic status.  I emphasized the importance of maximal medical management including strict  control of  blood pressure, blood glucose, and lipid levels, antiplatelet agents, obtaining regular exercise, and continued cessation of smoking.   The patient was given information about AAA including signs, symptoms, treatment, and how to minimize the risk of enlargement and rupture of aneurysms.    The patient was advised to call 911 should the patient experience sudden onset abdominal or back pain.   Thank you for allowing [place] to participate in this patient's care.  Clemon Chambers, RN, MSN, FNP-C [facility] [place] Office: [phone]  Clinic Physician: Early  11/29/2015, 8:58 AM

## 2015-12-05 ENCOUNTER — Ambulatory Visit (INDEPENDENT_AMBULATORY_CARE_PROVIDER_SITE_OTHER): Payer: Medicare HMO | Admitting: Pulmonary Disease

## 2015-12-05 ENCOUNTER — Encounter: Payer: Self-pay | Admitting: Pulmonary Disease

## 2015-12-05 ENCOUNTER — Other Ambulatory Visit: Payer: Self-pay

## 2015-12-05 ENCOUNTER — Telehealth: Payer: Self-pay | Admitting: Pulmonary Disease

## 2015-12-05 VITALS — BP 108/72 | HR 133 | Ht 67.0 in | Wt 156.6 lb

## 2015-12-05 DIAGNOSIS — R000 Tachycardia, unspecified: Secondary | ICD-10-CM | POA: Diagnosis not present

## 2015-12-05 DIAGNOSIS — J439 Emphysema, unspecified: Secondary | ICD-10-CM | POA: Diagnosis not present

## 2015-12-05 DIAGNOSIS — J9611 Chronic respiratory failure with hypoxia: Secondary | ICD-10-CM | POA: Diagnosis not present

## 2015-12-05 DIAGNOSIS — J849 Interstitial pulmonary disease, unspecified: Secondary | ICD-10-CM

## 2015-12-05 MED ORDER — PREDNISONE 10 MG PO TABS: ORAL_TABLET | ORAL | Status: AC

## 2015-12-05 NOTE — Progress Notes (Signed)
   Subjective:    Patient ID: Kenneth [name], male    DOB: 03/27/1936, 79 y.o.   MRN: [id number]  HPI  79yo male heavy ex smoker, quit 2007 for FU of  Persistent bilateral pulmonary infiltrates - evidence of ILD R>L dating back to 2007 ? Steroid responsive Hx of squamous cell carcinoma H/o LUlobectomy ([place]) 2007.  No known occupational or environmental exposure. Pets - 2 dogs. Quit smoking 2007. H/o LUlobectomy ([place]) 2007. Review shows chronic infx dating back to 2014  12/05/2015  Chief Complaint  Patient presents with  . Follow-up    Pt was seen by PCP this past week and diagnosed with pna R upper lobe. Pt c/o cough with clear mucus, wheeze, SOB and fatigue. Pt denies fever/CP/tightness. Pt is Levaquin and oral pred.     Saw dr vyas - treated for RUL infx as pneumonia with levaquin No cough or fever No pedal edma Breathing is worse, cannot do dishes or shower without huffing Down to 10 mg pred - higher dose caused sugars to go real high - 300 range breo not covered by insurance, on spiriva HR high today 130s - stakes bystolic prn BP 140 & above  I personally reviewed his imaging studies and compared to old films    Significant tests/ events  CT chest 07/2015>>> Progressive opacities in the right upper lobe, left upper lobe  left lower lobe concerning for worsening multifocal pneumonia. Mild COPD.  PFTs in 2009 showed a ratio of 50, DLCO of 69% and preserved lung volumes and flows.  PFTs 10/2015 - post bronchodilator, FEV1  at 69%, ratio 54, no significant bronchodilator response, FVC 90%, DLCO 39%.-decreased from 2009 .    12/2005 path >> invasive squamous cell carcinoma , lung parenchyma shows fibrosis and chronic inflammation  07/2015 TBBX non diagnostic, cultures neg ANA neg, RA pos, but CCP neg, ESR > 140  Review of Systems neg for any significant sore throat, dysphagia, itching, sneezing, nasal congestion or excess/ purulent secretions, fever, chills,  sweats, unintended wt loss, pleuritic or exertional cp, hempoptysis, orthopnea pnd or change in chronic leg swelling.  Also denies presyncope, palpitations, heartburn, abdominal pain, nausea, vomiting, diarrhea or change in bowel or urinary habits, dysuria,hematuria, rash, arthralgias, visual complaints, headache, numbness weakness or ataxia.     Objective:   Physical Exam  Gen. Pleasant, well-nourished, in no distress, normal affect ENT - no lesions, no post nasal drip Neck: No JVD, no thyromegaly, no carotid bruits Lungs: no use of accessory muscles, no dullness to percussion, clear without rales or rhonchi  Cardiovascular: Rhythm regular, tachy, heart sounds  normal, no murmurs or gallops, no peripheral edema Abdomen: soft and non-tender, no hepatosplenomegaly, BS normal. Musculoskeletal: No deformities, no cyanosis or clubbing Neuro:  alert, non focal       Assessment & Plan:   

## 2015-12-05 NOTE — Assessment & Plan Note (Addendum)
OK to stop levaquin once done Stay on 10 mg prednisone - refills #60 x 2 Once his breathing is stabilized-goal will be to lower prednisone to the lowest dose that keeps his symptoms controlled-drop to 5 mg on next visit

## 2015-12-05 NOTE — Assessment & Plan Note (Signed)
Ct 2L o2 - exertion/ sleep

## 2015-12-05 NOTE — Assessment & Plan Note (Signed)
bystoic was stopped due to low BP Restart 1/2 tab (2.5mg ) take readings & check with dr Woody Seller

## 2015-12-05 NOTE — Telephone Encounter (Signed)
ATC cheryl again and line is busy. Called pt # and received message VM not set up yet. WCB

## 2015-12-05 NOTE — Telephone Encounter (Signed)
[facility] is who they use

## 2015-12-05 NOTE — Patient Instructions (Signed)
OK to stop levaquin once done Stay on 10 mg prednisone - refills #60 x 2 Alternatives to BREO are SYMBICORT or ADVAIR - check with insurance & call us back Stay on spiriva

## 2015-12-05 NOTE — Assessment & Plan Note (Signed)
Alternatives to BREO are SYMBICORT or ADVAIR - check with insurance & call us back Stay on spiriva

## 2015-12-05 NOTE — Telephone Encounter (Signed)
ATC and line is busy [facility]

## 2015-12-06 NOTE — Telephone Encounter (Signed)
[phone] wife calling back

## 2015-12-06 NOTE — Telephone Encounter (Signed)
Will close message since issue has been resolved per the pt.

## 2015-12-06 NOTE — Telephone Encounter (Signed)
Pt called back and states this has been taken care of by his other doc and she wasn't sure what the problem was but they have it now

## 2015-12-06 NOTE — Telephone Encounter (Signed)
Attempted to contact pt's wife. No answer, voicemail has not been set up.

## 2015-12-14 ENCOUNTER — Encounter: Payer: Self-pay | Admitting: Internal Medicine

## 2015-12-14 ENCOUNTER — Ambulatory Visit (INDEPENDENT_AMBULATORY_CARE_PROVIDER_SITE_OTHER): Payer: Medicare HMO | Admitting: Internal Medicine

## 2015-12-14 VITALS — BP 140/82 | HR 79 | Ht 67.0 in | Wt 156.0 lb

## 2015-12-14 DIAGNOSIS — I255 Ischemic cardiomyopathy: Secondary | ICD-10-CM | POA: Diagnosis not present

## 2015-12-14 DIAGNOSIS — I5022 Chronic systolic (congestive) heart failure: Secondary | ICD-10-CM

## 2015-12-14 DIAGNOSIS — Z95810 Presence of automatic (implantable) cardiac defibrillator: Secondary | ICD-10-CM | POA: Diagnosis not present

## 2015-12-14 LAB — CUP PACEART INCLINIC DEVICE CHECK
Battery Remaining Longevity: 86 mo
Battery Voltage: 3 V
Brady Statistic AP VP Percent: 2.08 %
Brady Statistic AS VS Percent: 1.87 %
HighPow Impedance: 74 Ohm
Implantable Lead Implant Date: 20160601
Implantable Lead Implant Date: 20160601
Implantable Lead Implant Date: 20160601
Implantable Lead Location: 753858
Implantable Lead Location: 753860
Implantable Lead Model: 5076
Lead Channel Impedance Value: 342 Ohm
Lead Channel Impedance Value: 437 Ohm
Lead Channel Impedance Value: 494 Ohm
Lead Channel Impedance Value: 589 Ohm
Lead Channel Impedance Value: 627 Ohm
Lead Channel Impedance Value: 836 Ohm
Lead Channel Impedance Value: 836 Ohm
Lead Channel Impedance Value: 950 Ohm
Lead Channel Impedance Value: 969 Ohm
Lead Channel Pacing Threshold Amplitude: 0.625 V
Lead Channel Pacing Threshold Amplitude: 0.875 V
Lead Channel Pacing Threshold Pulse Width: 0.4 ms
Lead Channel Pacing Threshold Pulse Width: 0.4 ms
Lead Channel Sensing Intrinsic Amplitude: 2.75 mV
Lead Channel Sensing Intrinsic Amplitude: 3.125 mV
Lead Channel Setting Pacing Amplitude: 1.5 V
Lead Channel Setting Pacing Amplitude: 2 V
Lead Channel Setting Pacing Pulse Width: 0.4 ms
MDC IDC LEAD LOCATION: 753859
MDC IDC LEAD MODEL: 4598
MDC IDC MSMT LEADCHNL LV IMPEDANCE VALUE: 456 Ohm
MDC IDC MSMT LEADCHNL LV IMPEDANCE VALUE: 494 Ohm
MDC IDC MSMT LEADCHNL LV IMPEDANCE VALUE: 969 Ohm
MDC IDC MSMT LEADCHNL RA IMPEDANCE VALUE: 380 Ohm
MDC IDC MSMT LEADCHNL RV PACING THRESHOLD AMPLITUDE: 0.5 V
MDC IDC MSMT LEADCHNL RV PACING THRESHOLD PULSEWIDTH: 0.4 ms
MDC IDC MSMT LEADCHNL RV SENSING INTR AMPL: 17.5 mV
MDC IDC MSMT LEADCHNL RV SENSING INTR AMPL: 19.625 mV
MDC IDC SESS DTM: 20170201164248
MDC IDC SET LEADCHNL LV PACING AMPLITUDE: 2.5 V
MDC IDC SET LEADCHNL LV PACING PULSEWIDTH: 0.4 ms
MDC IDC SET LEADCHNL RV SENSING SENSITIVITY: 0.3 mV
MDC IDC STAT BRADY AP VS PERCENT: 0.13 %
MDC IDC STAT BRADY AS VP PERCENT: 95.92 %
MDC IDC STAT BRADY RA PERCENT PACED: 2.21 %
MDC IDC STAT BRADY RV PERCENT PACED: 96.76 %

## 2015-12-14 NOTE — Patient Instructions (Signed)
Medication Instructions: - Your physician recommends that you continue on your current medications as directed. Please refer to the Current Medication list given to you today.  Labwork: - none  Procedures/Testing: - none  Follow-Up: - Your physician wants you to follow-up in: June 2017 with Dr. Caryl Comes. You will receive a reminder letter in the mail two months in advance. If you don't receive a letter, please call our office to schedule the follow-up appointment.  Any Additional Special Instructions Will Be Listed Below (If Applicable).     If you need a refill on your cardiac medications before your next appointment, please call your pharmacy.

## 2015-12-14 NOTE — Progress Notes (Signed)
Electrophysiology Office Note   Date:  12/14/2015   ID:  ERIAN [name], DOB 1936/05/04, MRN [id number]  PCP:  [name]., MD  Cardiologist:  Quan.Mora Primary Electrophysiologist:   [name], MD    Chief Complaint  Patient presents with  . Pacemaker Check     History of Present Illness: Kenneth [name] is a 80 y.o. male   Seen in followup for    CRT implanted 6/16  He has hx of ischemic cardiomyopathy with prior bypass and ejection fraction of 35% and aortic valve replacement with Left bundle branch block QRS duration 150 ms and Congestive heart failure-class IIb-IIIa.;  In some time since his last evaluation for coronary artery disease. He underwent Myoview scanning by Dr. Wynonia Lawman which demonstrated no ischemia.  He also has hx of COPD/history of non-small cell lung cancer with left lobectomy   He has problems with shortness of breath which did not imprvoe significantyly although he has had an intercurrent bout with pneumonia that has left him oxygen dependen; he has not had peripheral  edema    The patient is tolerating medications without difficulties and is otherwise without complaint today.    Past Medical History  Diagnosis Date  . Abdominal aortic aneurysm without mention of rupture   . Kidney stones 05/03/09    HEMATURIA,CYSTOSCOPY DONE  . CAD (coronary artery disease), native coronary artery 03/10/2014    02/28/10 AVR with tissue valve, Cabg with LIMA to LAD, SVG to int, and SVG to RCA Dr. Roxan Hockey  Cath 02/16/2010 Calcified coronaries  LM calcified, LAD, diffuse disease, circ calcified with 60% mid, RCA calcified with 80% prox and 50% mid  36 mm Peak gradient, severe aortic stenosis  2003 Cypher stent in distal RCA , PTCA of diagonal 1996 PTCA of RCA in 2 places with restenosis treated again   . Hypertensive heart disease   . Diabetes mellitus type 2, noninsulin dependent ([place]) 03/10/2014  . COPD   . Chronic dyspnea   . Chronic diastolic heart failure ([place])    . Lumbar disc disease   . Hyperlipidemia   . History of lung cancer 12/13/2005    LUL lobe Stage 1 non small cell cancer treated with resection Dr. [name]   . GERD 08/06/2008  . Atrial fibrillation ([name])   . Cancer ([name]) 2007    Lung  . Hypertension   . LBBB (left bundle branch block)   . AICD (automatic cardioverter/defibrillator) present 04/13/2015    icd   . CHF (congestive heart failure) [facility])    Past Surgical History  Procedure Laterality Date  . Coronary artery bypass graft  02/2010  . Tissue aortic valve replacement  02/2010  . Tonsillectomy    . Lul resection  12/2005    DR, [name]  Lung Surgery  . Colonoscopy  2008    NORMAL  . Ep implantable device N/A 04/13/2015    Procedure: BiV ICD Insertion CRT-D;  Surgeon: [name], MD;  Location: [place] [facility];  Service: Cardiovascular;  Laterality: N/A;  . Cataract extraction Right   . Video bronchoscopy Bilateral 08/09/2015    Procedure: VIDEO BRONCHOSCOPY WITH FLUORO;  Surgeon: [name], MD;  Location: [place];  Service: Cardiopulmonary;  Laterality: Bilateral;     Current Outpatient Prescriptions  Medication Sig Dispense Refill  . albuterol (PROVENTIL) (2.5 MG/3ML) 0.083% nebulizer solution Take 2.5 mg by nebulization every 6 (six) hours as needed for wheezing or shortness of breath.     Marland Kitchen  aspirin 81 MG tablet Take 81 mg by mouth daily.     Marland Kitchen BREO ELLIPTA 200-25 MCG/INH AEPB Inhale 1 capsule into the lungs daily.    . finasteride (PROSCAR) 5 MG tablet Take 1 tablet by mouth every morning.    . furosemide (LASIX) 40 MG tablet Take 40 mg by mouth daily.     Marland Kitchen GLOBAL EASE INJECT PEN NEEDLES 31G X 5 MM MISC     . nebivolol (BYSTOLIC) 5 MG tablet Take 5 mg by mouth daily.    Marland Kitchen omeprazole (PRILOSEC) 40 MG capsule Take 40 mg by mouth daily.     . potassium chloride (K-DUR,KLOR-CON) 10 MEQ tablet Take 10 mEq by mouth every other day.     . predniSONE (DELTASONE) 10 MG tablet TAKE 1-2 TABLETS DAILY WITH BREAKFAST 60  tablet 2  . tiotropium (SPIRIVA) 18 MCG inhalation capsule Place 1 capsule (18 mcg total) into inhaler and inhale daily. 30 capsule 5   No current facility-administered medications for this visit.    Allergies:   Ciprocin-fluocin-procin; Clarithromycin; and Hydrocodone-acetaminophen   Social History:  The patient  reports that he quit smoking about 10 years ago. His smoking use included Cigarettes. He has a 50 pack-year smoking history. He has never used smokeless tobacco. He reports that he does not drink alcohol or use illicit drugs.   Family History:  The patient's family history includes Cancer in his mother; Dementia in his mother; Diabetes in his daughter and father; Heart attack in his brother and father; Heart disease in his brother and father; Hyperlipidemia in his brother and father; Hypertension in his brother and father; Peripheral vascular disease in his brother.    ROS:  Please see the history of present illness.   Otherwise, review of systems is negative .    PHYSICAL EXAM: VS:  BP 140/82 mmHg  Pulse 79  Ht 5\' 7"  (1.702 m)  Wt 156 lb (70.761 kg)  BMI 24.43 kg/m2 , BMI Body mass index is 24.43 kg/(m^2). GEN: Well nourished, well developed, in no acute distress HEENT: normal Neck: JVD 6-7, carotid bruits, or masses Cardiac: R RR; no murmur  rubs,  noS4  Respiratory:  clear to auscultation bilaterally, normal work of breathing Back without kyphosis or CVAT GI: soft, nontender, nondistended, + BS MS: no deformity or atrophy Skin: warm and dry,   Extremities No Clubbing cyanosis tr Edema Neuro:  Strength and sensation are intact Psych: euthymic mood, full affect  EKG:  EKG is ordered today. The ekg ordered today shows sinus rhythm at 68 with P synchronous pacing at an interval of 132 ms. With reprogramming became 120 ms initially QRS was negative V1 and +1 following reprogramming i.e. AV delay 100--70 QRS 1 became negative LBBB   Recent Labs: 08/02/2015: Magnesium  1.9 08/03/2015: ALT 24 08/10/2015: BUN 36*; Creatinine, Ser 1.84*; Hemoglobin 10.0*; Platelets 334; Potassium 3.7; Sodium 135    Lipid Panel  No results found for: CHOL, TRIG, HDL, CHOLHDL, VLDL, LDLCALC, LDLDIRECT   Wt Readings from Last 3 Encounters:  12/14/15 156 lb (70.761 kg)  12/05/15 156 lb 9.6 oz (71.033 kg)  11/29/15 154 lb 1.6 oz (69.899 kg)      Other studies Reviewed: Additional studies/ records that were reviewed today include: myoview   Review of the above records today demonstrates: As above    ASSESSMENT AND PLAN: Ischemic cardiomyopathy with prior bypass and ejection fraction of 35% and aortic valve replacement  Left bundle branch block QRS duration 150  ms  Congestive heart failure-class IIb-IIIa  COPD/history of non-small cell lung cancer with left lobectomy  Is hard to make sense as to his dyspnea. Clearly by ECG there had been no loss of LV capture noted by changes in the post implant ECG and a negative QRS in lead V1 to today. This is been addressed by shortening the AV delay. Hopefully there'll be a notable impact on his dyspnea.  He also requested that we do his remote follow-up. I told him that mostly Dr. Wynonia Lawman dozens on his own. I suggested that he have a conversation with Dr. Wynonia Lawman regarding remote follow-up and we are glad to do whatever the 2 of them decide    Labs/ tests ordered today include:    No orders of the defined types were placed in this encounter.     Disposition:  proeed with CRT implantation    Signed, [name], MD  12/14/2015 3:15 PM     [place] [address] [phone] (office) [phone] (fax)

## 2016-01-09 ENCOUNTER — Telehealth: Payer: Self-pay | Admitting: Internal Medicine

## 2016-01-09 NOTE — Telephone Encounter (Signed)
I left a message for the patient to call. 

## 2016-01-09 NOTE — Telephone Encounter (Signed)
Pt c/o swelling: STAT is pt has developed SOB within 24 hours  1. How long have you been experiencing swelling? About a week  2. Where is the swelling located? Legs , ankles, feet   3.  Are you currently taking a "fluid pill"?Yes Lasik  4.  Are you currently SOB? Yes  5.  Have you traveled recently?no

## 2016-01-16 ENCOUNTER — Ambulatory Visit: Payer: Medicare HMO | Admitting: Adult Health

## 2016-01-17 ENCOUNTER — Telehealth: Payer: Self-pay | Admitting: Pulmonary Disease

## 2016-01-17 NOTE — Telephone Encounter (Signed)
Reviewed CT angiogram from [facility] - Extensive changes of emphysema and fibrosis- -right upper lobe opacity is slightly worse-likely fibrosis, will need follow-up CT in 3-6 months

## 2016-01-20 ENCOUNTER — Ambulatory Visit: Payer: Medicare HMO | Admitting: Cardiology

## 2016-02-08 ENCOUNTER — Ambulatory Visit (INDEPENDENT_AMBULATORY_CARE_PROVIDER_SITE_OTHER): Payer: Medicare HMO | Admitting: Pulmonary Disease

## 2016-02-08 ENCOUNTER — Encounter: Payer: Self-pay | Admitting: Pulmonary Disease

## 2016-02-08 VITALS — BP 130/70 | HR 60 | Ht 67.0 in | Wt 155.6 lb

## 2016-02-08 DIAGNOSIS — J849 Interstitial pulmonary disease, unspecified: Secondary | ICD-10-CM

## 2016-02-08 DIAGNOSIS — J449 Chronic obstructive pulmonary disease, unspecified: Secondary | ICD-10-CM

## 2016-02-08 DIAGNOSIS — J9611 Chronic respiratory failure with hypoxia: Secondary | ICD-10-CM | POA: Diagnosis not present

## 2016-02-08 NOTE — Assessment & Plan Note (Signed)
Stay on 5 mg prednisone If well x 3 mnths, try to taper further

## 2016-02-08 NOTE — Assessment & Plan Note (Signed)
Stay on breo & spiriva

## 2016-02-08 NOTE — Progress Notes (Signed)
   Subjective:    Patient ID: Kenneth [name], male    DOB: 08/03/1936, 80 y.o.   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 Steroid responsive Hx of squamous cell carcinoma H/o LUlobectomy ([place]) 2007.  No known occupational or environmental exposure. Pets - 2 dogs. Quit smoking 2007. H/o LUlobectomy ([place]) 2007. Review shows chronic infx dating back to 2014   02/08/2016  Chief Complaint  Patient presents with  . Follow-up    Breathing doing okay, what's to discuss if there are any other medicaitons that can help him.     45mFU Went from 10 to 584mx 1 mnth due to high CBGs Saw cards  - clavinger ([place]) - stopped bystolic, started on coreg  No cough or fever No pedal edma Breathing remains poor, cannot do dishes or shower without dyspnea on breo & spiriva   I personally reviewed his imaging studies and compared to old films    Significant tests/ events  CT chest 07/2015>>> Progressive opacities in the right upper lobe, left upper lobe  left lower lobe concerning for worsening multifocal pneumonia. Mild COPD.  PFTs in 2009 showed a ratio of 50, DLCO of 69% and preserved lung volumes and flows.  PFTs 10/2015 - post bronchodilator, FEV1  at 69%, ratio 54, no significant bronchodilator response, FVC 90%, DLCO 39%.-decreased from 2009 .    12/2005 path >> invasive squamous cell carcinoma , lung parenchyma shows fibrosis and chronic inflammation  07/2015 TBBX non diagnostic, cultures neg ANA neg, RA pos, but CCP neg, ESR > 140  Review of Systems Patient denies significant dyspnea,cough, hemoptysis,  chest pain, palpitations, pedal edema, orthopnea, paroxysmal nocturnal dyspnea, lightheadedness, nausea, vomiting, abdominal or  leg pains      Objective:   Physical Exam  Gen. Pleasant, well-nourished, in no distress ENT - no lesions, no post nasal  drip Neck: No JVD, no thyromegaly, no carotid bruits Lungs: no use of accessory muscles, no dullness to percussion, clear without rales or rhonchi  Cardiovascular: Rhythm regular, heart sounds  normal, no murmurs or gallops, no peripheral edema Musculoskeletal: No deformities, no cyanosis or clubbing        Assessment & Plan:

## 2016-02-08 NOTE — Assessment & Plan Note (Signed)
Ct 2 L o2 Check on RA next visit

## 2016-02-08 NOTE — Patient Instructions (Signed)
Stay on 5 mg prednisone Stay on breo & spiriva Call as needed

## 2016-04-25 ENCOUNTER — Ambulatory Visit: Payer: Medicare HMO | Admitting: Pulmonary Disease

## 2016-05-10 ENCOUNTER — Other Ambulatory Visit: Payer: Self-pay | Admitting: Adult Health

## 2016-06-08 ENCOUNTER — Encounter: Payer: Self-pay | Admitting: Family

## 2016-06-12 ENCOUNTER — Ambulatory Visit: Payer: Medicare HMO | Admitting: Family

## 2016-06-12 ENCOUNTER — Other Ambulatory Visit (HOSPITAL_COMMUNITY): Payer: Medicare HMO

## 2016-07-09 IMAGING — CT CT CHEST W/O CM
2 of 4 series · 15 of 36 positions shown, 18 images · non-contrast
Comparison: Chest CT 07/26/2015

CLINICAL DATA: Cough, fever, shortness of breath. History of lung
cancer.

EXAM:
CT CHEST WITHOUT CONTRAST
TECHNIQUE: Multidetector CT imaging of the chest was performed following the
standard protocol without IV contrast.

[Series 2: thorax 5.0 i31f 1 · axial · 0.73mm/px · z∈[+1381,+1686]mm · 12 of 73 slices shown, 15 images]
[im 6/73  mediastinal]
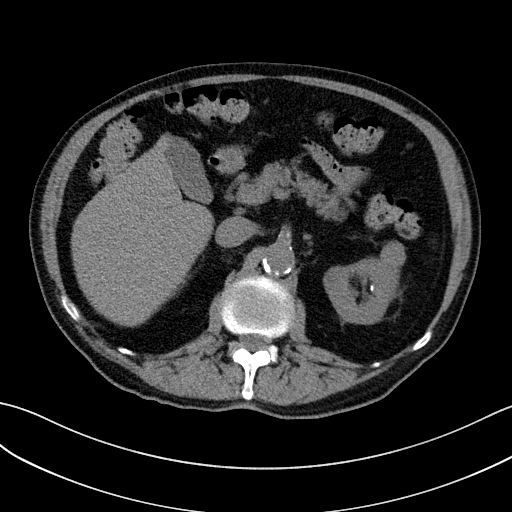
[im 6/73  lung]
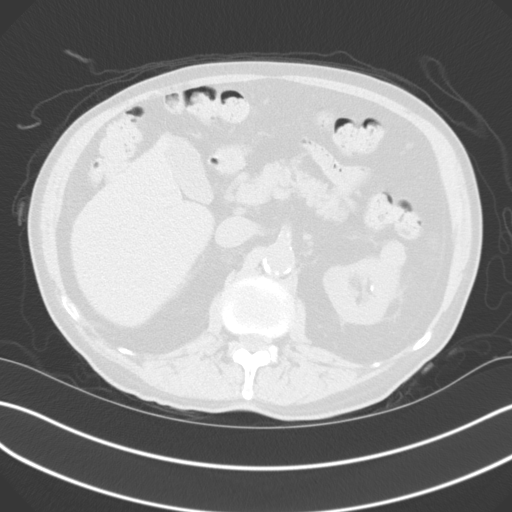
[im 11/73  lung]
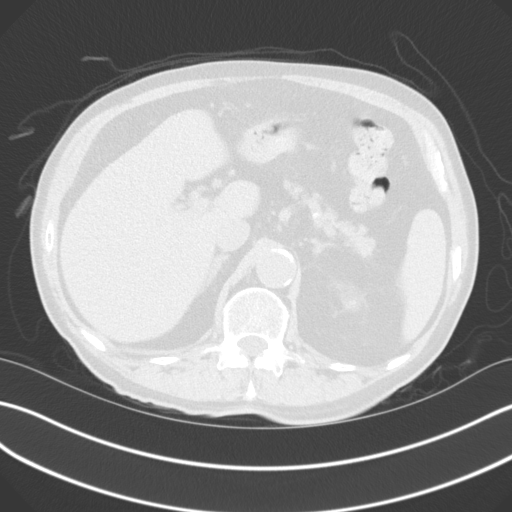
[im 16/73  lung]
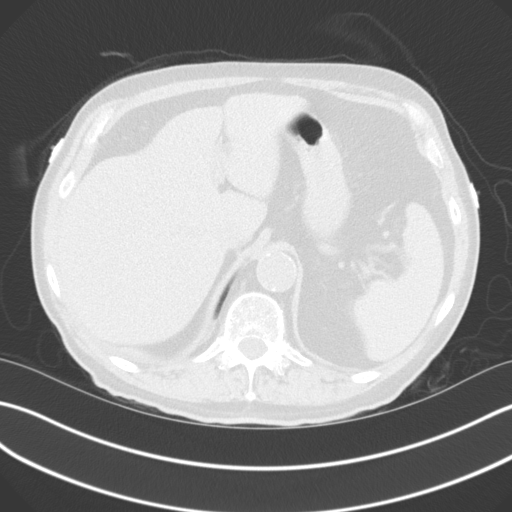
[im 21/73  lung]
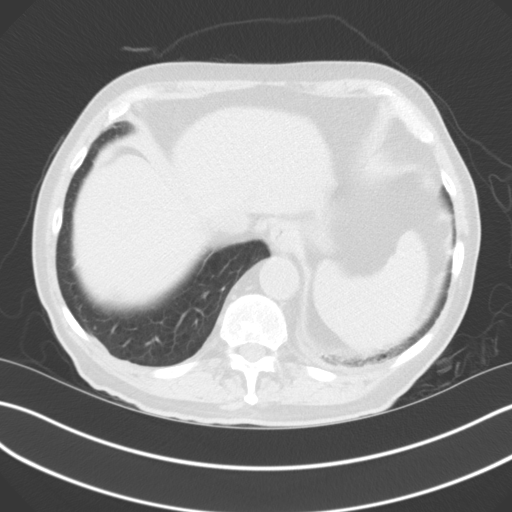
[im 26/73  mediastinal]
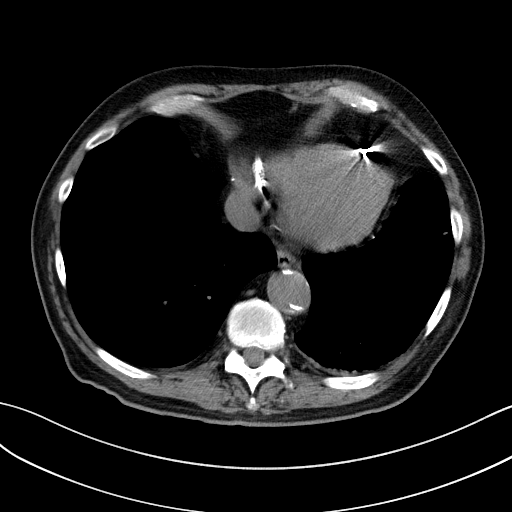
[im 26/73  lung]
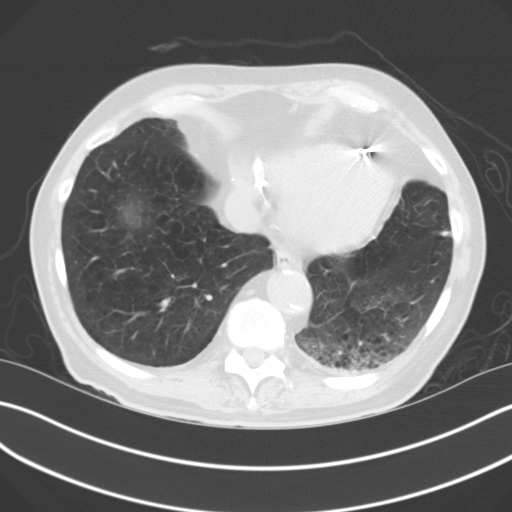
[im 31/73  lung]
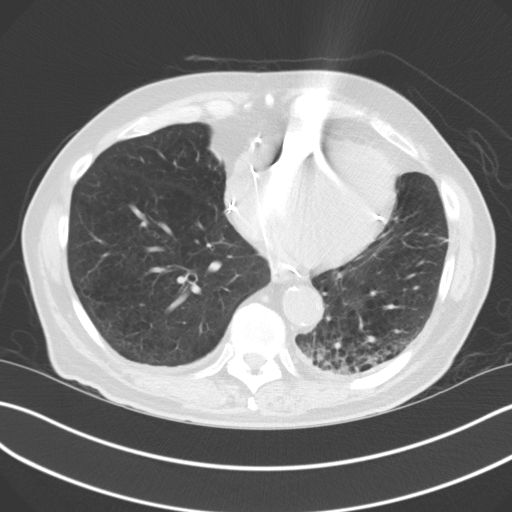
[im 42/73  lung]
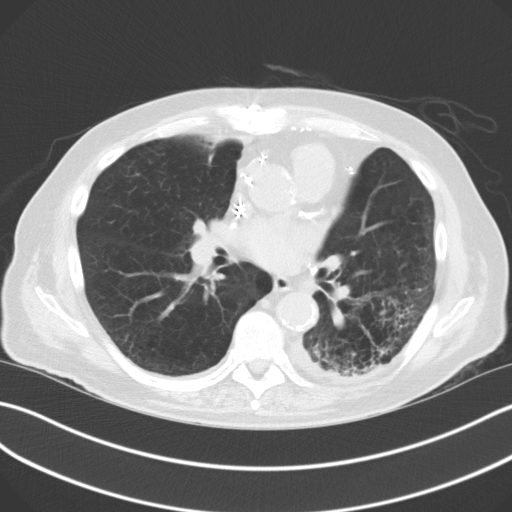
[im 47/73  lung]
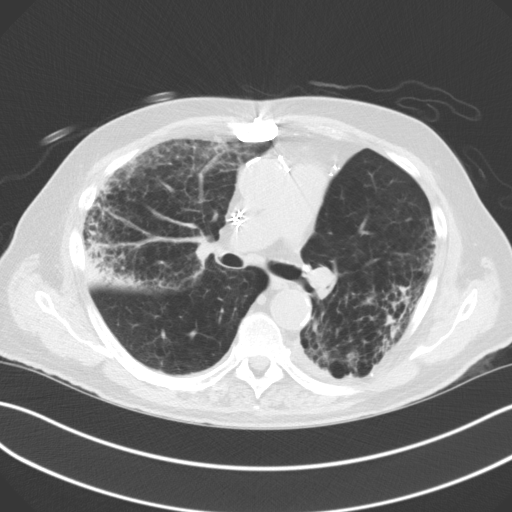
[im 52/73  mediastinal]
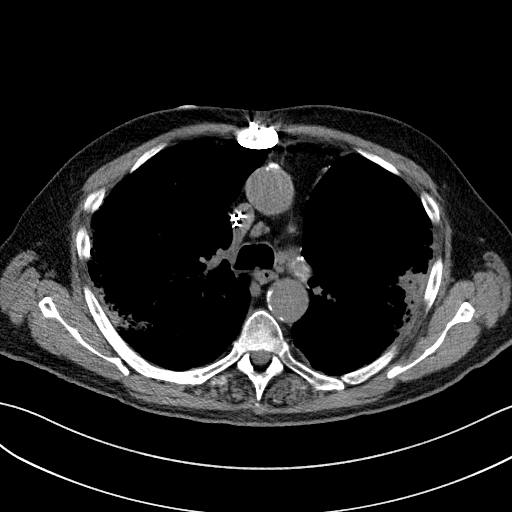
[im 52/73  lung]
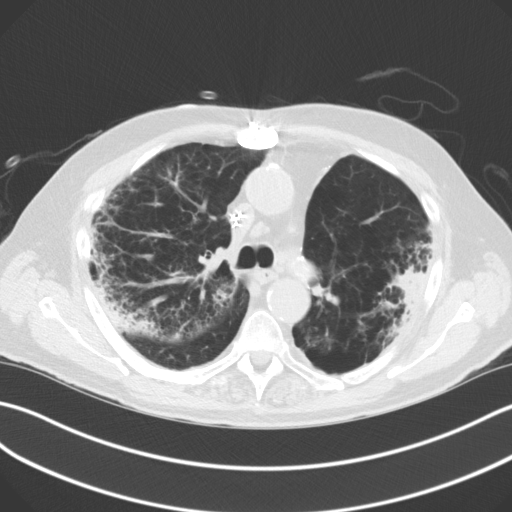
[im 57/73  lung]
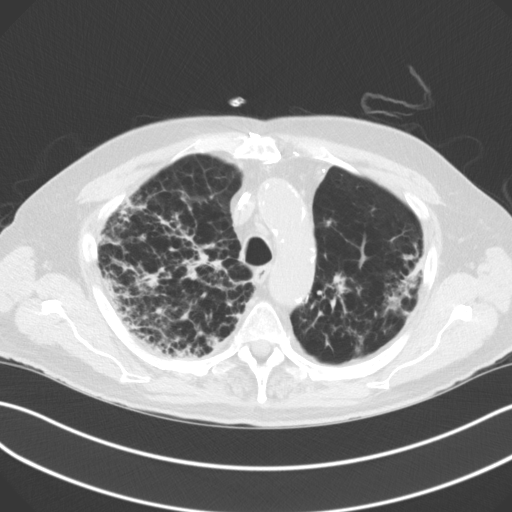
[im 62/73  lung]
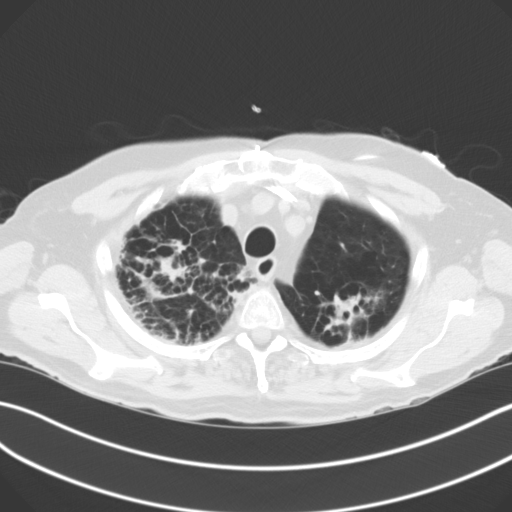
[im 67/73  lung]
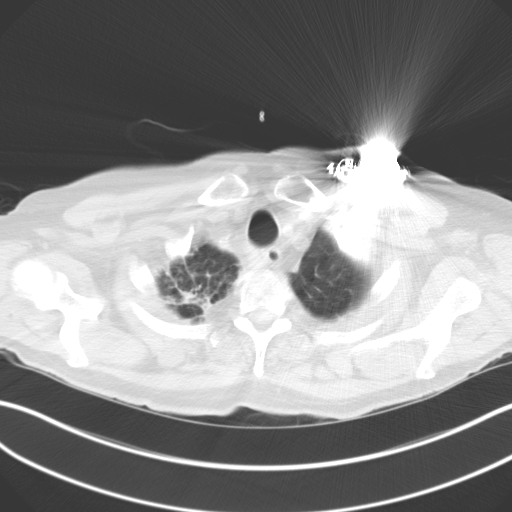

[Series 5: coronal · coronal · 0.71mm/px · 3 of 86 slices shown]
[im 18/86  lung]
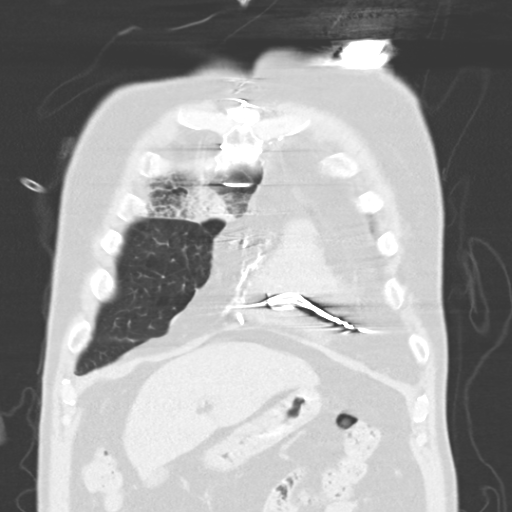
[im 35/86  lung]
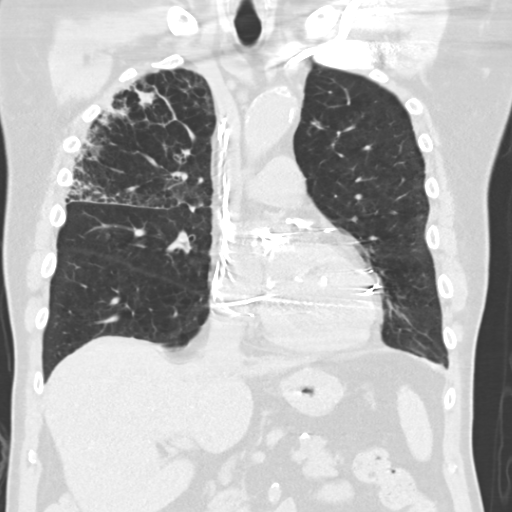
[im 52/86  lung]
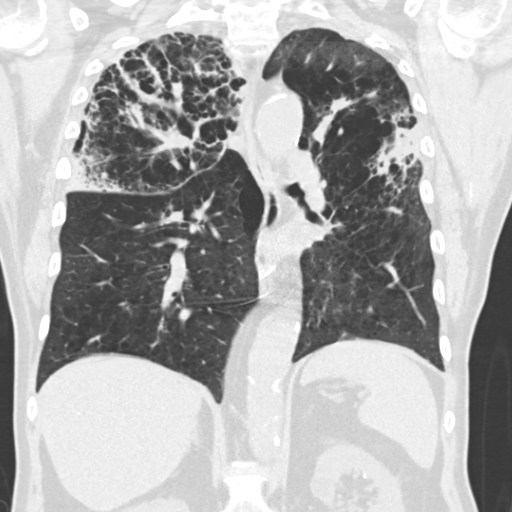

[15 of 36 positions shown; findings below may reference images not displayed]

FINDINGS: Progressive right upper lobe interstitial thickening and airspace
disease as well as left upper lobe and lower lobe opacities.
Findings most compatible with pneumonia. No pleural effusions.
Underlying COPD.

Heart is normal size. Aorta is normal caliber. Prior CABG. Pacer in
stable position. Small scattered mediastinal lymph nodes, none
pathologically enlarged. No hilar or axillary adenopathy.

Chest wall soft tissues are unremarkable. Imaging into the upper
abdomen shows no acute findings.
IMPRESSION: Progressive opacities in the right upper lobe, left upper lobe and
left lower lobe concerning for worsening multifocal pneumonia.

Mild COPD.

## 2016-07-10 ENCOUNTER — Encounter: Payer: Self-pay | Admitting: Family

## 2016-07-11 IMAGING — DX DG CHEST 1V PORT
1 series · 1 of 1 positions shown · non-contrast
Comparison: 08/07/2015

CLINICAL DATA: Status post bronchoscopy with biopsy

EXAM:
PORTABLE CHEST - 1 VIEW

[chest ap]
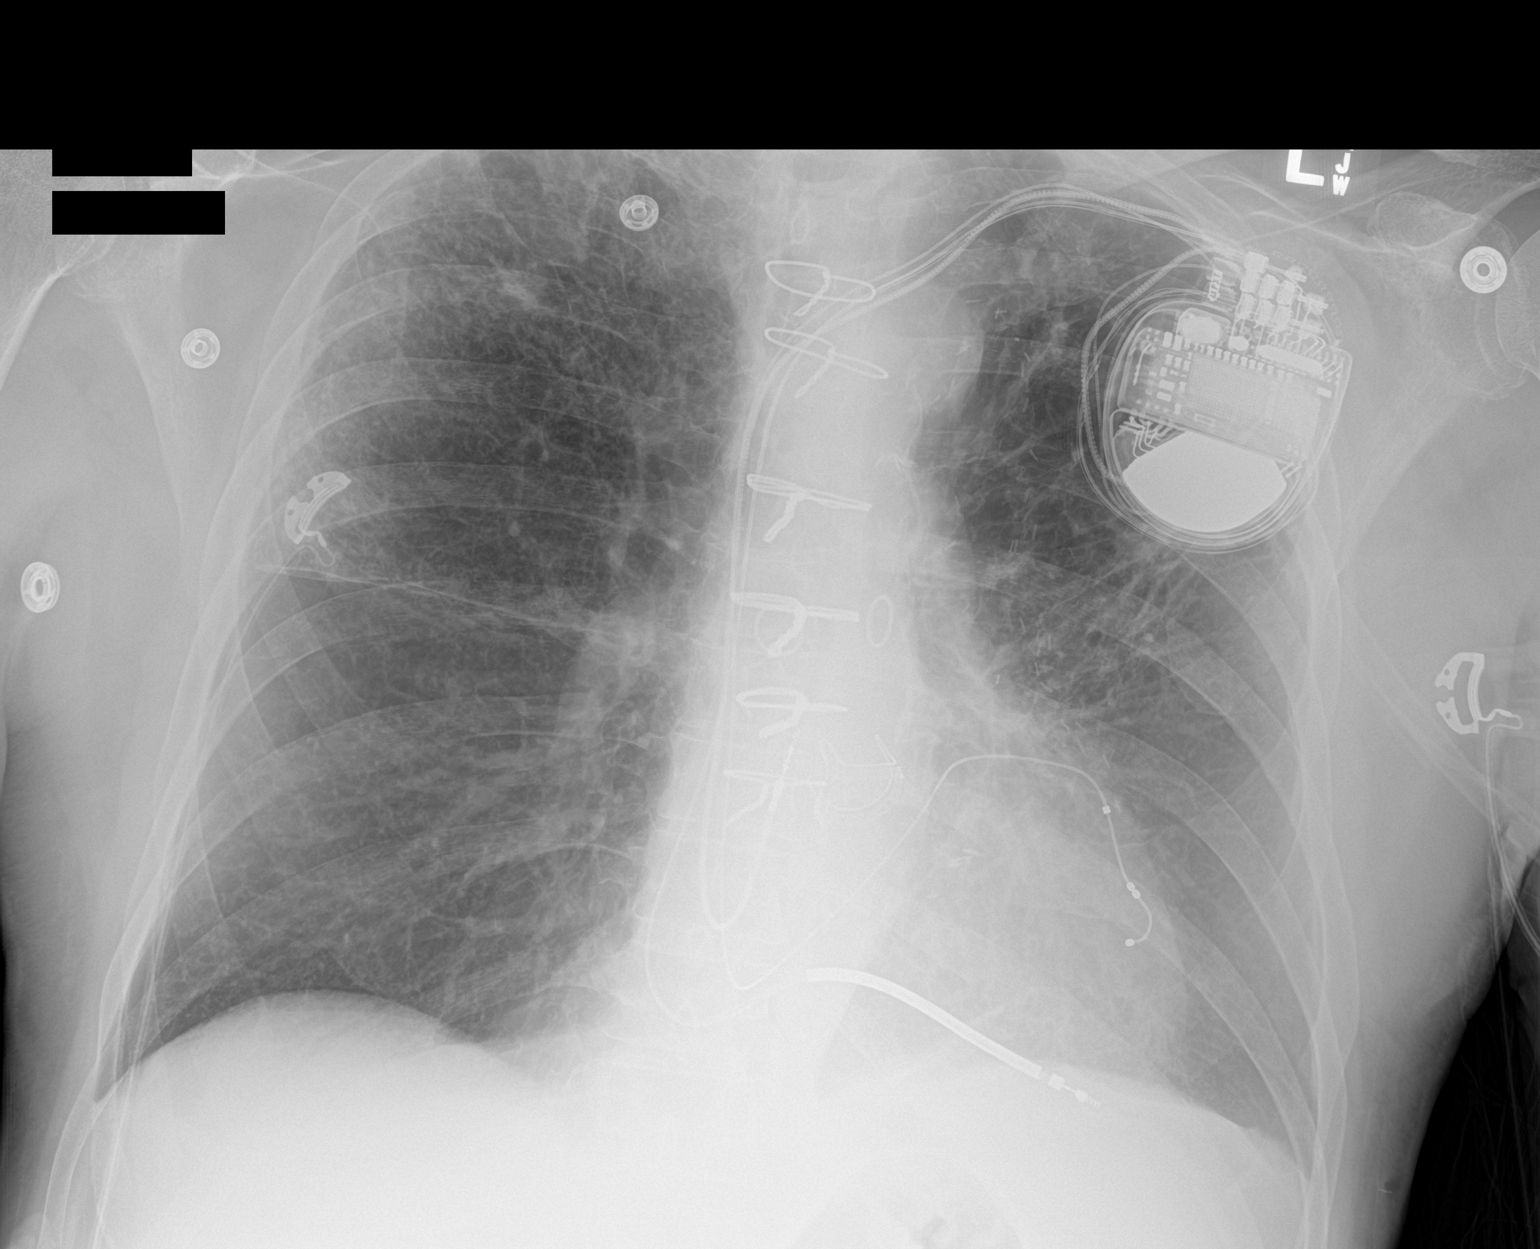

[1 of 1 positions shown; findings below may reference images not displayed]

FINDINGS: Cardiac shadow is stable. A defibrillator is again noted. Patchy
airspace changes are again identified in both lungs similar to that
seen on prior exam. No pneumothorax is noted following recent
bronchoscopy. No bony abnormality is seen.
IMPRESSION: Stable appearance of the chest without acute abnormality.

## 2016-07-20 ENCOUNTER — Ambulatory Visit (HOSPITAL_COMMUNITY)
Admission: RE | Admit: 2016-07-20 | Discharge: 2016-07-20 | Disposition: A | Discharge status: Home / Self Care | Payer: Medicare HMO | Source: Ambulatory Visit | Attending: Family | Admitting: Family

## 2016-07-20 ENCOUNTER — Ambulatory Visit (INDEPENDENT_AMBULATORY_CARE_PROVIDER_SITE_OTHER): Payer: Medicare HMO | Admitting: Family

## 2016-07-20 ENCOUNTER — Encounter: Payer: Self-pay | Admitting: Family

## 2016-07-20 VITALS — BP 139/79 | HR 93 | Ht 67.0 in | Wt 149.4 lb

## 2016-07-20 DIAGNOSIS — Z87891 Personal history of nicotine dependence: Secondary | ICD-10-CM

## 2016-07-20 DIAGNOSIS — I714 Abdominal aortic aneurysm, without rupture, unspecified: Secondary | ICD-10-CM

## 2016-07-20 DIAGNOSIS — I723 Aneurysm of iliac artery: Secondary | ICD-10-CM | POA: Insufficient documentation

## 2016-07-20 NOTE — Progress Notes (Signed)
[facility] [place]   CC: Follow up Abdominal Aortic Aneurysm  History of Present Illness  Kenneth [name] is a 80 y.o. (02-20-1936) male patient of Dr. Donnetta Hutching who presents with chief complaint: follow up for AAA. Previous studies demonstrate an AAA, measuring 4.72 cm. The patient does not have back or abdominal pain. The patient is a former smoker.  Patient states his blood pressure is usually 128-138/70-80; he states that he takes his antihypertensive medication if his blood pressure gets above 140/90, he states as advised by Dr. Wynonia Lawman.  The patient denies claudication in legs with walking, denies a history of stroke or TIA symptoms.   He had a CTA of his chest in [facility], in [place], in July of August of 2017 bronchitis and pneumonia, nodule found.   He had a 3 vessel CABG and aortic valve replacement in 2011, his cardiologist is Dr. Wynonia Lawman.  Crestor caused myalgias, is taking Red Yeast Rice instead.  He is taking a daily ASA and a beta blocker.  He rarely uses ETOH.  He is physically active, is dyspneic with moderate exertion.  His brother had an AAA in addition to ESRD, but died with his first dialysis session.   He had a pacemaker/defibrillator inserted in June 2016 for low EF; pt states he feels no improvement in energy.  Pt Diabetic: No, on prednisone for COPD breathing issues. His blood sugar has increased due to this, he takes rapid acting insulin and checks his blood sugar  Pt smoker: former smoker, quit in 2007  Dr. Donnetta Hutching last saw patient on 05/24/15. At that time duplex ultrasound revealed no change from 6 months prior. Maximal diameter was 5.1 cm which was the same reading as October 2015. Stable 5.1 cm aneurysm with no growth. Dr. Donnetta Hutching explained to pt that his annual risk for rupture would be significantly less than 5% and therefore would recommend continued observation. CT scan from 2015 showed that he would be a stent  graft candidate from his current anatomy.   Past Medical History:  Diagnosis Date  . Abdominal aortic aneurysm without mention of rupture   . AICD (automatic cardioverter/defibrillator) present 04/13/2015   icd   . Atrial fibrillation ([place])   . CAD (coronary artery disease), native coronary artery 03/10/2014   02/28/10 AVR with tissue valve, Cabg with LIMA to LAD, SVG to int, and SVG to RCA Dr. Roxan Hockey  Cath 02/16/2010 Calcified coronaries  LM calcified, LAD, diffuse disease, circ calcified with 60% mid, RCA calcified with 80% prox and 50% mid  36 mm Peak gradient, severe aortic stenosis  2003 Cypher stent in distal RCA , PTCA of diagonal 1996 PTCA of RCA in 2 places with restenosis treated again   . Cancer ([place]) 2007   Lung  . CHF (congestive heart failure) ([place])   . Chronic diastolic heart failure ([place])   . Chronic dyspnea   . COPD   . Diabetes mellitus type 2, noninsulin dependent ([place]) 03/10/2014  . GERD 08/06/2008  . History of lung cancer 12/13/2005   LUL lobe Stage 1 non small cell cancer treated with resection Dr. [name]   . Hyperlipidemia   . Hypertension   . Hypertensive heart disease   . Kidney stones 05/03/09   HEMATURIA,CYSTOSCOPY DONE  . LBBB (left bundle branch block)   . Lumbar disc disease    Past Surgical History:  Procedure Laterality Date  . CATARACT EXTRACTION Right   . COLONOSCOPY  2008   NORMAL  .  CORONARY ARTERY BYPASS GRAFT  02/2010  . EP IMPLANTABLE DEVICE N/A 04/13/2015   Procedure: BiV ICD Insertion CRT-D;  Surgeon: [name], MD;  Location: [facility];  Service: Cardiovascular;  Laterality: N/A;  . LUL RESECTION  12/2005   DR, [name]  Lung Surgery  . TISSUE AORTIC VALVE REPLACEMENT  02/2010  . TONSILLECTOMY    . VIDEO BRONCHOSCOPY Bilateral 08/09/2015   Procedure: VIDEO BRONCHOSCOPY WITH FLUORO;  Surgeon: [name], MD;  Location: [facility];  Service: Cardiopulmonary;  Laterality: Bilateral;   Social History Social History   Social  History  . Marital status: Married    Spouse name: N/A  . Number of children: N/A  . Years of education: N/A   Occupational History  . lumber retail     Social History Main Topics  . Smoking status: Former Smoker    Packs/day: 1.00    Years: 50.00    Types: Cigarettes    Quit date: 12/13/2005  . Smokeless tobacco: Never Used     Comment: remotely quit smoking  . Alcohol use No  . Drug use: No  . Sexual activity: Not on file   Other Topics Concern  . 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 Heart disease Father     MI/CABG  . Hyperlipidemia Father   . Hypertension Father   . Heart attack Father     Several attacks  . Diabetes Father   . Cancer Mother     Breast  . Dementia Mother   . Heart disease Brother     Before age 56  . Hyperlipidemia Brother   . Hypertension Brother   . Heart attack Brother   . Peripheral vascular disease Brother   . Diabetes Daughter     Current Outpatient Prescriptions on File Prior to Visit  Medication Sig Dispense Refill  . albuterol (PROVENTIL) (2.5 MG/3ML) 0.083% nebulizer solution Take 2.5 mg by nebulization every 6 (six) hours as needed for wheezing or shortness of breath.     Marland Kitchen aspirin 81 MG tablet Take 81 mg by mouth daily.     Marland Kitchen BREO ELLIPTA 200-25 MCG/INH AEPB Inhale 1 capsule into the lungs daily.    . carvedilol (COREG) 6.25 MG tablet Take by mouth.    . finasteride (PROSCAR) 5 MG tablet Take 1 tablet by mouth every morning.    . furosemide (LASIX) 40 MG tablet Take 40 mg by mouth daily.     Marland Kitchen GLOBAL EASE INJECT PEN NEEDLES 31G X 5 MM MISC     . levofloxacin (LEVAQUIN) 500 MG tablet     . losartan (COZAAR) 25 MG tablet Take by mouth.    Marland Kitchen omeprazole (PRILOSEC) 40 MG capsule Take 40 mg by mouth daily.     . potassium chloride  (K-DUR,KLOR-CON) 10 MEQ tablet Take 10 mEq by mouth every other day.     . predniSONE (DELTASONE) 10 MG tablet TAKE 1-2 TABLETS DAILY WITH BREAKFAST 60 tablet 2  . SPIRIVA HANDIHALER 18 MCG inhalation capsule INHALE THE CONTENTS OF 1 CAPSULE EVERY DAY 30 capsule 5   No current facility-administered medications on file prior to visit.    Allergies  Allergen Reactions  . Ciprocin-Fluocin-Procin [Fluocinolone] Nausea Only  . Clarithromycin Nausea Only  Altered taste  . Hydrocodone-Acetaminophen Nausea Only    ROS: See HPI for pertinent positives and negatives.  Physical Examination  Vitals:   07/20/16 0850  BP: 139/79  Pulse: 93  SpO2: 93%  Weight: 149 lb 6.4 oz (67.8 kg)  Height: 5\' 7"  (1.702 m)   Body mass index is 23.4 kg/m.  General: A&O x 3, WD.  Pulmonary: Sym exp, limited air movt, CTAB, no rales, rhonchi, or wheezing. Occasional cough, supplemental oxygen via nasal canula, 2L/min. Cardiac: RRR, Nl S1, S2, no detected murmur, pacemaker/defibrillator left upper chest subcutaneous.   Carotid Bruits  Left  Right    Negative  Negative   Aorta is not palpable  Radial pulses are 2+ palpable and =.   VASCULAR EXAM:  LE Pulses  LEFT  RIGHT   FEMORAL  2+palpable  2+palpable   POPLITEAL  not palpable  not palpable   POSTERIOR TIBIAL  Not palpable  Not palpable   DORSALIS PEDIS  ANTERIOR TIBIAL  2+palpable  2+palpable    Gastrointestinal: soft, NTND, -G/R, - HSM, - masses palpated, - CVAT B.  Musculoskeletal: M/S 5/5 throughout, Extremities without ischemic changes. Feet and lower legs with 2+ pitting edema.   Neurologic: CN 2-12 intact, Pain and light touch intact in extremities are intact, motor exam as listed above.    Non-Invasive Vascular Imaging  AAA Duplex (07/20/2016)  Previous size: 5.2 cm (Date: 11/29/15)  Current size:  5.38 cm (Date: 07/20/16), right common iliac artery: 1.85 cm, left common iliac artery: 1.15 cm  Medical  Decision Making  The patient is a 80 y.o. male who presents with asymptomatic AAA with slight increase in size.  His creatinine in September 2016 was 1.84 He had a CTA chest a couple of months ago at [facility].    Based on this patient's exam and diagnostic studies, and after discussing with Dr. Bridgett Larsson, the patient will follow up in 6 months with the following studies: AAA duplex, see Dr. Donnetta Hutching.  Consideration for repair of AAA would be made when the size is 5.5 cm, growth > 1 cm/yr, and symptomatic status.  I emphasized the importance of maximal medical management including strict control of blood pressure, blood glucose, and lipid levels, antiplatelet agents, obtaining regular exercise, and continued cessation of smoking.   The patient was given information about AAA including signs, symptoms, treatment, and how to minimize the risk of enlargement and rupture of aneurysms.    The patient was advised to call 911 should the patient experience sudden onset abdominal or back pain.   Thank you for allowing [place] to participate in this patient's care.  Clemon Chambers, RN, MSN, FNP-C [facility] [place] Office: [phone]  Clinic Physician: Donzetta Matters  07/20/2016, 8:53 AM

## 2016-07-20 NOTE — Patient Instructions (Signed)
Abdominal Aortic Aneurysm An aneurysm is a weakened or damaged part of an artery wall that bulges from the normal force of blood pumping through the body. An abdominal aortic aneurysm is an aneurysm that occurs in the lower part of the aorta, the main artery of the body.  The major concern with an abdominal aortic aneurysm is that it can enlarge and burst (rupture) or blood can flow between the layers of the wall of the aorta through a tear (aorticdissection). Both of these conditions can cause bleeding inside the body and can be life threatening unless diagnosed and treated promptly. CAUSES  The exact cause of an abdominal aortic aneurysm is unknown. Some contributing factors are:   A hardening of the arteries caused by the buildup of fat and other substances in the lining of a blood vessel (arteriosclerosis).  Inflammation of the walls of an artery (arteritis).   Connective tissue diseases, such as Marfan syndrome.   Abdominal trauma.   An infection, such as syphilis or staphylococcus, in the wall of the aorta (infectious aortitis) caused by bacteria. RISK FACTORS  Risk factors that contribute to an abdominal aortic aneurysm may include:  Age older than 60 years.   High blood pressure (hypertension).  Male gender.  Ethnicity (white race).  Obesity.  Family history of aneurysm (first degree relatives only).  Tobacco use. PREVENTION  The following healthy lifestyle habits may help decrease your risk of abdominal aortic aneurysm:  Quitting smoking. Smoking can raise your blood pressure and cause arteriosclerosis.  Limiting or avoiding alcohol.  Keeping your blood pressure, blood sugar level, and cholesterol levels within normal limits.  Decreasing your salt intake. In somepeople, too much salt can raise blood pressure and increase your risk of abdominal aortic aneurysm.  Eating a diet low in saturated fats and cholesterol.  Increasing your fiber intake by including  whole grains, vegetables, and fruits in your diet. Eating these foods may help lower blood pressure.  Maintaining a healthy weight.  Staying physically active and exercising regularly. SYMPTOMS  The symptoms of abdominal aortic aneurysm may vary depending on the size and rate of growth of the aneurysm.Most grow slowly and do not have any symptoms. When symptoms do occur, they may include:  Pain (abdomen, side, lower back, or groin). The pain may vary in intensity. A sudden onset of severe pain may indicate that the aneurysm has ruptured.  Feeling full after eating only small amounts of food.  Nausea or vomiting or both.  Feeling a pulsating lump in the abdomen.  Feeling faint or passing out. DIAGNOSIS  Since most unruptured abdominal aortic aneurysms have no symptoms, they are often discovered during diagnostic exams for other conditions. An aneurysm may be found during the following procedures:  Ultrasonography (A one-time screening for abdominal aortic aneurysm by ultrasonography is also recommended for all men aged 65-75 years who have ever smoked).  X-ray exams.  A computed tomography (CT).  Magnetic resonance imaging (MRI).  Angiography or arteriography. TREATMENT  Treatment of an abdominal aortic aneurysm depends on the size of your aneurysm, your age, and risk factors for rupture. Medication to control blood pressure and pain may be used to manage aneurysms smaller than 6 cm. Regular monitoring for enlargement may be recommended by your caregiver if:  The aneurysm is 3-4 cm in size (an annual ultrasonography may be recommended).  The aneurysm is 4-4.5 cm in size (an ultrasonography every 6 months may be recommended).  The aneurysm is larger than 4.5 cm in   size (your caregiver may ask that you be examined by a vascular surgeon). If your aneurysm is larger than 6 cm, surgical repair may be recommended. There are two main methods for repair of an aneurysm:   Endovascular  repair (a minimally invasive surgery). This is done most often.  Open repair. This method is used if an endovascular repair is not possible.   This information is not intended to replace advice given to you by your health care provider. Make sure you discuss any questions you have with your health care provider.   Document Released: 08/08/2005 Document Revised: 02/23/2013 Document Reviewed: 11/28/2012 Elsevier Interactive Patient Education 2016 [employer]  

## 2016-07-29 ENCOUNTER — Encounter (HOSPITAL_COMMUNITY)
Admission: EM | Disposition: A | Discharge status: Home / Self Care | Payer: Self-pay | Source: Home / Self Care | Attending: Emergency Medicine

## 2016-07-29 HISTORY — PX: ABDOMINAL AORTIC ANEURYSM REPAIR: SHX42

## 2016-07-29 SURGERY — ANEURYSM ABDOMINAL AORTIC REPAIR
Anesthesia: General | Site: Abdomen

## 2016-07-29 SURGICAL SUPPLY — 42 items
ATTRACTOMAT 16X20 MAGNETIC DRP (DRAPES) ×1 IMPLANT
BALLN CODA OCL 2-9.0-35-120-3 (BALLOONS) ×3
BALLOON COD OCL 2-9.0-35-120-3 (BALLOONS) IMPLANT
CANISTER SUCTION 2500CC (MISCELLANEOUS) ×3 IMPLANT
CANNULA VESSEL 3MM 2 BLNT TIP (CANNULA) ×3 IMPLANT
CATH ANGIO 5F BER2 65CM (CATHETERS) IMPLANT
CLIP TI MEDIUM 24 (CLIP) ×1 IMPLANT
DRYSEAL FLEXSHEATH 12FR 33CM (SHEATH) ×2
ELECT BLADE 6.5 EXT (BLADE) IMPLANT
ELECT REM PT RETURN 9FT ADLT (ELECTROSURGICAL) ×3
ELECTRODE REM PT RTRN 9FT ADLT (ELECTROSURGICAL) ×1 IMPLANT
GLOVE BIO SURGEON STRL SZ7.5 (GLOVE) ×3 IMPLANT
GOWN STRL REUS W/ TWL LRG LVL3 (GOWN DISPOSABLE) ×3 IMPLANT
GOWN STRL REUS W/TWL LRG LVL3 (GOWN DISPOSABLE) ×9
GUIDEWIRE WHOLEY .035 145 JTIP (WIRE) IMPLANT
INSERT FOGARTY 61MM (MISCELLANEOUS) ×6 IMPLANT
INSERT FOGARTY SM (MISCELLANEOUS) ×12 IMPLANT
KIT BASIN OR (CUSTOM PROCEDURE TRAY) ×3 IMPLANT
KIT ROOM TURNOVER OR (KITS) ×3 IMPLANT
LOOP VESSEL MINI RED (MISCELLANEOUS) IMPLANT
NDL PERC 18GX7CM (NEEDLE) IMPLANT
NEEDLE PERC 18GX7CM (NEEDLE) ×3 IMPLANT
NS IRRIG 1000ML POUR BTL (IV SOLUTION) ×6 IMPLANT
PACK AORTA (CUSTOM PROCEDURE TRAY) ×3 IMPLANT
PAD ARMBOARD 7.5X6 YLW CONV (MISCELLANEOUS) ×6 IMPLANT
RETAINER VISCERA MED (MISCELLANEOUS) ×3 IMPLANT
SHEATH AVANTI 11CM 8FR (MISCELLANEOUS) ×2 IMPLANT
SHEATH BRITE TIP 7FRX11 (SHEATH) IMPLANT
SHEATH DRYSEAL FLEX 12FR 33CM (SHEATH) IMPLANT
SPONGE LAP 18X18 X RAY DECT (DISPOSABLE) IMPLANT
SPONGE SURGIFOAM ABS GEL 100 (HEMOSTASIS) IMPLANT
STAPLER VISISTAT 35W (STAPLE) ×6 IMPLANT
SUT PROLENE 3 0 SH DA (SUTURE) IMPLANT
SUT PROLENE 3 0 SH1 36 (SUTURE) ×2 IMPLANT
SUT PROLENE 6 0 C 1 30 (SUTURE) ×3 IMPLANT
SUT SILK 2 0SH CR/8 30 (SUTURE) ×3 IMPLANT
SUT VIC AB 2-0 SH 27 (SUTURE)
SUT VIC AB 2-0 SH 27XBRD (SUTURE) IMPLANT
SUT VIC AB 3-0 SH 27 (SUTURE) ×9
SUT VIC AB 3-0 SH 27X BRD (SUTURE) ×3 IMPLANT
TRAY FOLEY W/METER SILVER 16FR (SET/KITS/TRAYS/PACK) ×1 IMPLANT
WIRE BENTSON .035X145CM (WIRE) ×2 IMPLANT

## 2016-07-30 ENCOUNTER — Emergency Department (HOSPITAL_COMMUNITY): Payer: Medicare HMO | Admitting: Certified Registered"

## 2016-07-30 ENCOUNTER — Encounter (HOSPITAL_COMMUNITY): Payer: Self-pay | Admitting: Vascular Surgery

## 2016-07-30 ENCOUNTER — Emergency Department (HOSPITAL_COMMUNITY): Payer: Medicare HMO

## 2016-07-30 ENCOUNTER — Emergency Department (HOSPITAL_COMMUNITY)
Admission: EM | Admit: 2016-07-30 | Discharge: 2016-08-12 | Disposition: E | Payer: Medicare HMO | Attending: Emergency Medicine | Admitting: Emergency Medicine

## 2016-07-30 DIAGNOSIS — I469 Cardiac arrest, cause unspecified: Secondary | ICD-10-CM | POA: Diagnosis not present

## 2016-07-30 DIAGNOSIS — Z85118 Personal history of other malignant neoplasm of bronchus and lung: Secondary | ICD-10-CM | POA: Insufficient documentation

## 2016-07-30 DIAGNOSIS — I110 Hypertensive heart disease with heart failure: Secondary | ICD-10-CM | POA: Insufficient documentation

## 2016-07-30 DIAGNOSIS — Z792 Long term (current) use of antibiotics: Secondary | ICD-10-CM | POA: Diagnosis not present

## 2016-07-30 DIAGNOSIS — I4891 Unspecified atrial fibrillation: Secondary | ICD-10-CM | POA: Insufficient documentation

## 2016-07-30 DIAGNOSIS — I2119 ST elevation (STEMI) myocardial infarction involving other coronary artery of inferior wall: Secondary | ICD-10-CM | POA: Insufficient documentation

## 2016-07-30 DIAGNOSIS — Z7952 Long term (current) use of systemic steroids: Secondary | ICD-10-CM | POA: Insufficient documentation

## 2016-07-30 DIAGNOSIS — I5032 Chronic diastolic (congestive) heart failure: Secondary | ICD-10-CM | POA: Diagnosis not present

## 2016-07-30 DIAGNOSIS — I713 Abdominal aortic aneurysm, ruptured, unspecified: Secondary | ICD-10-CM

## 2016-07-30 DIAGNOSIS — Z79899 Other long term (current) drug therapy: Secondary | ICD-10-CM | POA: Diagnosis not present

## 2016-07-30 DIAGNOSIS — Z95810 Presence of automatic (implantable) cardiac defibrillator: Secondary | ICD-10-CM | POA: Diagnosis not present

## 2016-07-30 DIAGNOSIS — Z953 Presence of xenogenic heart valve: Secondary | ICD-10-CM | POA: Diagnosis not present

## 2016-07-30 DIAGNOSIS — K219 Gastro-esophageal reflux disease without esophagitis: Secondary | ICD-10-CM | POA: Diagnosis not present

## 2016-07-30 DIAGNOSIS — Z7982 Long term (current) use of aspirin: Secondary | ICD-10-CM | POA: Insufficient documentation

## 2016-07-30 DIAGNOSIS — J449 Chronic obstructive pulmonary disease, unspecified: Secondary | ICD-10-CM | POA: Diagnosis not present

## 2016-07-30 DIAGNOSIS — I2510 Atherosclerotic heart disease of native coronary artery without angina pectoris: Secondary | ICD-10-CM | POA: Insufficient documentation

## 2016-07-30 DIAGNOSIS — I428 Other cardiomyopathies: Secondary | ICD-10-CM | POA: Insufficient documentation

## 2016-07-30 DIAGNOSIS — I959 Hypotension, unspecified: Secondary | ICD-10-CM | POA: Insufficient documentation

## 2016-07-30 DIAGNOSIS — E785 Hyperlipidemia, unspecified: Secondary | ICD-10-CM | POA: Insufficient documentation

## 2016-07-30 DIAGNOSIS — I472 Ventricular tachycardia, unspecified: Secondary | ICD-10-CM

## 2016-07-30 DIAGNOSIS — Z7951 Long term (current) use of inhaled steroids: Secondary | ICD-10-CM | POA: Diagnosis not present

## 2016-07-30 DIAGNOSIS — I2584 Coronary atherosclerosis due to calcified coronary lesion: Secondary | ICD-10-CM | POA: Insufficient documentation

## 2016-07-30 DIAGNOSIS — E119 Type 2 diabetes mellitus without complications: Secondary | ICD-10-CM | POA: Insufficient documentation

## 2016-07-30 DIAGNOSIS — Z951 Presence of aortocoronary bypass graft: Secondary | ICD-10-CM | POA: Insufficient documentation

## 2016-07-30 DIAGNOSIS — Z87891 Personal history of nicotine dependence: Secondary | ICD-10-CM | POA: Insufficient documentation

## 2016-07-30 DIAGNOSIS — Z955 Presence of coronary angioplasty implant and graft: Secondary | ICD-10-CM | POA: Insufficient documentation

## 2016-07-30 LAB — COMPREHENSIVE METABOLIC PANEL
ALT: 17 U/L (ref 17–63)
AST: 41 U/L (ref 15–41)
Albumin: 2.4 g/dL — ABNORMAL LOW (ref 3.5–5.0)
Alkaline Phosphatase: 35 U/L — ABNORMAL LOW (ref 38–126)
Anion gap: 17 — ABNORMAL HIGH (ref 5–15)
BILIRUBIN TOTAL: 0.6 mg/dL (ref 0.3–1.2)
BUN: 32 mg/dL — AB (ref 6–20)
CO2: 15 mmol/L — ABNORMAL LOW (ref 22–32)
CREATININE: 1.94 mg/dL — AB (ref 0.61–1.24)
Calcium: 7.6 mg/dL — ABNORMAL LOW (ref 8.9–10.3)
Chloride: 106 mmol/L (ref 101–111)
GFR calc Af Amer: 37 mL/min — ABNORMAL LOW (ref >60)
GFR, EST NON AFRICAN AMERICAN: 32 mL/min — AB (ref >60)
GLUCOSE: 545 mg/dL — AB (ref 65–99)
Potassium: 4.3 mmol/L (ref 3.5–5.1)
Sodium: 138 mmol/L (ref 135–145)
TOTAL PROTEIN: 4.1 g/dL — AB (ref 6.5–8.1)

## 2016-07-30 LAB — I-STAT CHEM 8, ED
BUN: 44 mg/dL — AB (ref 6–20)
CHLORIDE: 104 mmol/L (ref 101–111)
CREATININE: 1.8 mg/dL — AB (ref 0.61–1.24)
Calcium, Ion: 1.01 mmol/L — ABNORMAL LOW (ref 1.15–1.40)
Glucose, Bld: 524 mg/dL (ref 65–99)
HCT: 20 % — ABNORMAL LOW (ref 39.0–52.0)
Hemoglobin: 6.8 g/dL — CL (ref 13.0–17.0)
POTASSIUM: 4.4 mmol/L (ref 3.5–5.1)
Sodium: 139 mmol/L (ref 135–145)
TCO2: 18 mmol/L (ref 0–100)

## 2016-07-30 LAB — CBC
HCT: 20.4 % — ABNORMAL LOW (ref 39.0–52.0)
Hemoglobin: 6.6 g/dL — CL (ref 13.0–17.0)
MCH: 33.2 pg (ref 26.0–34.0)
MCHC: 32.4 g/dL (ref 30.0–36.0)
MCV: 102.5 fL — AB (ref 78.0–100.0)
PLATELETS: 150 10*3/uL (ref 150–400)
RBC: 1.99 MIL/uL — ABNORMAL LOW (ref 4.22–5.81)
RDW: 14.9 % (ref 11.5–15.5)
WBC: 14.8 10*3/uL — AB (ref 4.0–10.5)

## 2016-07-30 LAB — I-STAT TROPONIN, ED: Troponin i, poc: 0.06 ng/mL (ref 0.00–0.08)

## 2016-07-30 LAB — I-STAT ARTERIAL BLOOD GAS, ED
Acid-base deficit: 10 mmol/L — ABNORMAL HIGH (ref 0.0–2.0)
BICARBONATE: 16 mmol/L — AB (ref 20.0–28.0)
O2 Saturation: 99 %
PCO2 ART: 33.2 mmHg (ref 32.0–48.0)
TCO2: 17 mmol/L (ref 0–100)
pH, Arterial: 7.291 — ABNORMAL LOW (ref 7.350–7.450)
pO2, Arterial: 169 mmHg — ABNORMAL HIGH (ref 83.0–108.0)

## 2016-07-30 LAB — PREPARE FRESH FROZEN PLASMA
Unit division: 0
Unit division: 0

## 2016-07-30 LAB — CBG MONITORING, ED: Glucose-Capillary: 199 mg/dL — ABNORMAL HIGH (ref 65–99)

## 2016-07-30 LAB — TROPONIN I: TROPONIN I: 0.08 ng/mL — AB (ref <0.03)

## 2016-07-30 LAB — PREPARE RBC (CROSSMATCH)

## 2016-07-30 LAB — I-STAT CG4 LACTIC ACID, ED: Lactic Acid, Venous: 12.44 mmol/L (ref 0.5–1.9)

## 2016-07-30 LAB — PROTIME-INR
INR: 1.18
PROTHROMBIN TIME: 15.1 s (ref 11.4–15.2)

## 2016-07-30 MED ORDER — SODIUM BICARBONATE 8.4 % IV SOLN
INTRAVENOUS | Status: AC | PRN
Start: 2016-07-30 — End: 2016-07-30
  Administered 2016-07-30 (×2): 100 meq via INTRAVENOUS

## 2016-07-30 MED ORDER — ETOMIDATE 2 MG/ML IV SOLN
INTRAVENOUS | Status: AC | PRN
  Administered 2016-07-30: 30 mg via INTRAVENOUS

## 2016-07-30 MED ORDER — IOPAMIDOL (ISOVUE-300) INJECTION 61%
100.0000 mL | Freq: Once | INTRAVENOUS | Status: AC | PRN
  Administered 2016-07-30: 100 mL via INTRAVENOUS

## 2016-07-30 MED ORDER — SODIUM CHLORIDE 0.9 % IV SOLN: 10.0000 mL/h | Freq: Once | INTRAVENOUS | Status: DC

## 2016-07-30 MED ORDER — DEXTROSE 5 % IV SOLN
INTRAVENOUS | Status: AC | PRN
  Administered 2016-07-30: 300 mg via INTRAVENOUS

## 2016-07-30 MED ORDER — SUCCINYLCHOLINE CHLORIDE 20 MG/ML IJ SOLN
INTRAMUSCULAR | Status: AC | PRN
  Administered 2016-07-30: 100 mg via INTRAVENOUS

## 2016-07-30 MED ORDER — SODIUM BICARBONATE 8.4 % IV SOLN
INTRAVENOUS | Status: DC | PRN
  Administered 2016-07-30 (×2): 50 meq via INTRAVENOUS

## 2016-07-30 MED ORDER — SODIUM BICARBONATE 8.4 % IV SOLN
INTRAVENOUS | Status: DC | PRN
  Administered 2016-07-30: 50 meq via INTRAVENOUS

## 2016-07-30 MED ORDER — LACTATED RINGERS IV SOLN
INTRAVENOUS | Status: DC | PRN
  Administered 2016-07-30: 06:00:00 via INTRAVENOUS

## 2016-07-30 MED ORDER — SODIUM BICARBONATE 8.4 % IV SOLN
INTRAVENOUS | Status: AC | PRN
  Administered 2016-07-30: 50 meq via INTRAVENOUS

## 2016-07-30 MED ORDER — EPINEPHRINE HCL 0.1 MG/ML IJ SOSY
PREFILLED_SYRINGE | INTRAMUSCULAR | Status: AC | PRN
  Administered 2016-07-30 (×4): 1 via INTRAVENOUS

## 2016-07-30 MED ORDER — EPINEPHRINE HCL 0.1 MG/ML IJ SOSY
PREFILLED_SYRINGE | INTRAMUSCULAR | Status: AC | PRN
  Administered 2016-07-30: 1 mg via INTRAVENOUS

## 2016-07-30 MED ORDER — EPINEPHRINE HCL 0.1 MG/ML IJ SOSY
PREFILLED_SYRINGE | INTRAMUSCULAR | Status: DC | PRN
  Administered 2016-07-30: 1 mg via INTRAVENOUS

## 2016-07-30 MED ORDER — SODIUM CHLORIDE 0.9 % IV SOLN
INTRAVENOUS | Status: AC | PRN
  Administered 2016-07-30: 1000 mL via INTRAVENOUS

## 2016-07-30 MED ORDER — EPINEPHRINE HCL 1 MG/ML IJ SOLN
INTRAMUSCULAR | Status: DC | PRN
  Administered 2016-07-30 (×3): 1 mg via INTRAVENOUS

## 2016-07-30 MED ORDER — EPINEPHRINE HCL 0.1 MG/ML IJ SOSY
PREFILLED_SYRINGE | INTRAMUSCULAR | Status: AC | PRN
  Administered 2016-07-30 (×3): 1 via INTRAVENOUS

## 2016-07-30 MED ORDER — EPINEPHRINE HCL 0.1 MG/ML IJ SOSY
PREFILLED_SYRINGE | INTRAMUSCULAR | Status: AC
  Filled 2016-07-30: qty 10

## 2016-07-30 MED ORDER — DEXTROSE 5 % IV SOLN
0.0000 ug/min | Freq: Once | INTRAVENOUS | Status: AC
  Administered 2016-07-30: 10 ug/min via INTRAVENOUS

## 2016-07-30 MED ORDER — SODIUM BICARBONATE 8.4 % IV SOLN
INTRAVENOUS | Status: AC
  Filled 2016-07-30: qty 150

## 2016-07-30 MED FILL — Medication: Qty: 1 | Status: AC

## 2016-07-31 LAB — TYPE AND SCREEN
ABO/RH(D): A POS
ANTIBODY SCREEN: NEGATIVE
UNIT DIVISION: 0
UNIT DIVISION: 0
UNIT DIVISION: 0
UNIT DIVISION: 0
UNIT DIVISION: 0
Unit division: 0

## 2016-08-12 NOTE — ED Notes (Signed)
2nd unit PRBC's transfusion complete via pressure bag.

## 2016-08-12 NOTE — ED Notes (Signed)
1st unit PRBCs finished via pressure bag. 2nd Unit started via pressure bag.

## 2016-08-12 NOTE — Code Documentation (Signed)
1st unit PRBCs started via pressure bag.

## 2016-08-12 NOTE — Transfer of Care (Signed)
Immediate Anesthesia Transfer of Care Note  Patient: Kenneth [name]  Procedure(s) Performed: Procedure(s): ANEURYSM ABDOMINAL AORTIC REPAIR (N/A)    Time of death [id number]

## 2016-08-12 NOTE — OR Nursing (Signed)
[facility] Note: Referral Number#[id number] CDS Coordinator: Page Harlow Ohms HOLD FOR POSSIBLE TISSUE DONATION

## 2016-08-12 NOTE — Anesthesia Procedure Notes (Signed)
Central Venous Catheter Insertion Performed by: anesthesiologist Patient location: OR. Preanesthetic checklist: patient identified, IV checked, site marked, risks and benefits discussed, surgical consent, monitors and equipment checked, pre-op evaluation, timeout performed and anesthesia consent Lidocaine 1% used for infiltration Landmarks identified and Seldinger technique used Catheter size: 9 Fr Sheath introducer Procedure performed using ultrasound guided technique. Attempts: 1 Following insertion, line sutured, dressing applied and Biopatch. Post procedure assessment: blood return through all ports, free fluid flow and no air. Patient tolerated the procedure well with no immediate complications. Additional procedure comments: Placed emergently in OR. Sterile gloves and CHG prep.Marland Kitchen

## 2016-08-12 NOTE — ED Notes (Signed)
While on CT table patient noted to have very low blood pressure. No pulse detectable. CPR started.

## 2016-08-12 NOTE — H&P (Signed)
Referring Physician: Dr Stann Mainland ER  Patient name: Kenneth [name] MRN: [id number] DOB: 1935/12/10 Sex: male  REASON FOR CONSULT: ruptured AAA HPI: Kenneth [name] is a 80 y.o. male, presented to ER with shortness of breath abdominal pain around 4 am.  [name] thought to be having MI.  Pt progressed to point of intermittent CPR.  Was found to have ruptured AAA. Other medical problems include CAD, CHF, low EF requiring AICD, afib, COPD, hypertension, DM, hyperlipid.  Was seen in our office about 2 weeks ago AAA 5.4 cm.   Past Medical History:  Diagnosis Date  . Abdominal aortic aneurysm without mention of rupture   . AICD (automatic cardioverter/defibrillator) present 04/13/2015   icd   . Atrial fibrillation ([place])   . CAD (coronary artery disease), native coronary artery 03/10/2014   02/28/10 AVR with tissue valve, Cabg with LIMA to LAD, SVG to int, and SVG to RCA Dr. Roxan Hockey  Cath 02/16/2010 Calcified coronaries  LM calcified, LAD, diffuse disease, circ calcified with 60% mid, RCA calcified with 80% prox and 50% mid  36 mm Peak gradient, severe aortic stenosis  2003 Cypher stent in distal RCA , PTCA of diagonal 1996 PTCA of RCA in 2 places with restenosis treated again   . Cancer ([place]) 2007   Lung  . CHF (congestive heart failure) ([place])   . Chronic diastolic heart failure ([place])   . Chronic dyspnea   . COPD   . Diabetes mellitus type 2, noninsulin dependent ([place]) 03/10/2014  . GERD 08/06/2008  . History of lung cancer 12/13/2005   LUL lobe Stage 1 non small cell cancer treated with resection Dr. [name]   . Hyperlipidemia   . Hypertension   . Hypertensive heart disease   . Kidney stones 05/03/09   HEMATURIA,CYSTOSCOPY DONE  . LBBB (left bundle branch block)   . Lumbar disc disease    Past Surgical History:  Procedure Laterality Date  . CATARACT EXTRACTION Right   . COLONOSCOPY  2008   NORMAL  . CORONARY ARTERY BYPASS GRAFT  02/2010  . EP IMPLANTABLE DEVICE N/A 04/13/2015   Procedure: BiV ICD Insertion CRT-D;  Surgeon: [name], MD;  Location: [facility] [facility];  Service: Cardiovascular;  Laterality: N/A;  . LUL RESECTION  12/2005   DR, [name]  Lung Surgery  . TISSUE AORTIC VALVE REPLACEMENT  02/2010  . TONSILLECTOMY    . VIDEO BRONCHOSCOPY Bilateral 08/09/2015   Procedure: VIDEO BRONCHOSCOPY WITH FLUORO;  Surgeon: [name], MD;  Location: [facility];  Service: Cardiopulmonary;  Laterality: Bilateral;    Family History  Problem Relation Age of Onset  . Heart disease Father     MI/CABG  . Hyperlipidemia Father   . Hypertension Father   . Heart attack Father     Several attacks  . Diabetes Father   . Cancer Mother     Breast  . Dementia Mother   . Heart disease Brother     Before age 8  . Hyperlipidemia Brother   . Hypertension Brother   . Heart attack Brother   . Peripheral vascular disease Brother   . Diabetes Daughter     SOCIAL HISTORY: Social History   Social History  . Marital status: Married    Spouse name: N/A  . Number of children: N/A  . Years of education: N/A   Occupational History  . lumber retail     Social History Main Topics  . Smoking status: Former Smoker  Packs/day: 1.00    Years: 50.00    Types: Cigarettes    Quit date: 12/13/2005  . Smokeless tobacco: Never Used     Comment: remotely quit smoking  . Alcohol use No  . Drug use: No  . Sexual activity: Not on file   Other Topics Concern  . 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 Ciprocin-Fluocin-Procin [Fluocinolone] Nausea Only  . Clarithromycin Nausea Only    Altered taste  . Hydrocodone-Acetaminophen Nausea Only    Current Facility-Administered Medications  Medication Dose Route Frequency Provider Last Rate Last Dose  . [MAR Hold] 0.9 %  sodium chloride infusion  10  mL/hr Intravenous Once Kristen N Ward, DO      . EPINEPHrine (ADRENALIN) 0.1 MG/ML injection            Facility-Administered Medications Ordered in Other Encounters  Medication Dose Route Frequency Provider Last Rate Last Dose  . EPINEPHrine (ADRENALIN) injection    Anesthesia Intra-op Babs Bertin, CRNA   1 mg at Aug 10, 2016 [id number]  . lactated ringers infusion    Continuous PRN Babs Bertin, CRNA      . sodium bicarbonate injection    Anesthesia Intra-op Babs Bertin, CRNA   50 mEq at Aug 10, 2016 0616    ROS:   Unable to obtain pt on vent  Physical Examination  Vitals:   2016-08-10 0538 2016/08/10 0540 08/10/2016 0542 August 10, 2016 0544  BP: (!) 128/43 (!) 129/27 122/67 (!) 66/45  Pulse: (!) 143 (!) 145 (!) 159   Resp: 26 (!) 29 (!) 36 13  SpO2: (!) 43% (!) 71% (!) 70%     There is no height or weight on file to calculate BMI.  General: obtunded HEENT: Normal Cardiac: intermittent rhythm, BP absent to 50s Abdomen: distended Skin: No rash Extremity Pulses:  absent pulses bilaterally Musculoskeletal: diffuse lower extremity edema  Neurologic: no movement  DATA:  Ruptured AAA on CT 28 mm neck 12 mm iliacs  ASSESSMENT:  To OR for repair of AAA.  Family updated on risk of death.  Ruta Hinds, MD [facility] [place] Office: [phone] Pager: [phone]

## 2016-08-12 NOTE — Consult Note (Signed)
Cardiology Consult Note  Admit date: 07-31-2016 Name: Kenneth [name] 80 y.o.  male DOB:  Mar 23, 1936 MRN:  [id number]  Today's date:  07/31/16  Referring Physician:    [facility]  Reason for Consultation:    Cardiac arrest   IMPRESSIONS: 1.  Ruptured abdominal aneurysm 2.  Hypotension due to #1 3.  Inferior myocardial infarction-acute by EKG likely due to hypotension 4.  Cardiomyopathy 5.  History of biventricular defibrillator 6.  Severe COPD 7.  History of lung cancer treated with lobectomy  RECOMMENDATION: The patient is critically ill at the present time having had CPR for PEA and is currently being transfused.  I discussed with the family and I don't think that he will survive this episode.  Vascular surgery has been consulted and they're on the way in to evaluate whether or not he would be a candidate for repair.  Because of his ST elevation initially a code STEMI was called but this has been canceled and I would agree with this as he is not a candidate to go to the catheterization laboratory.  HISTORY: This 80 year old male is well-known to me.  He has a history of lung cancer and has a prior history of coronary artery disease.  He had aortic valve replacement with a tissue valve in 2011 and had a mammary graft to LAD vein graft intermediate vein graft to the right coronary artery at the time.  He has had a non-small cell lung cancer treated with resection in the past and is known hypertensive heart disease.  He had a biventricular defibrillator for a cardiomyopathy implanted in June 2016.  He at that time had a left bundle branch block also.  He has changed care to a cardiologist in [place].  More recently he presented to the hospital in extremis and the details of this are not well known.  He more recently had lifted a heavy box of flooring and had developed severe back pain.  He called the chiropractor and had been seen a chiropractor but continued to complain of  back pain.  He is unable to sleep this evening he was sitting up in a chair at the bedside and had fairly severe abdominal pain radiating to the back as well as severe shortness of breath and was brought here by ambulance.  After arrival he became pulseless and CPR was started and was in the hospital and came in to see him.  He has been given 500 cc of fluid in route and was because of agonal respirations was intubated and noted to have a lactate of 12.  He has been given bicarbonate is currently being transfused and a repeat EKG showed ST elevation inferiorly.  He was taken to the CT scanner where he was confirmed to have a leaking aneurysm by CT scan and vascular surgery is currently being called.  History is obtained from the family.  The patient is currently intubated with a pressure of around 50.  Past Medical History:  Diagnosis Date  . Abdominal aortic aneurysm without mention of rupture   . AICD (automatic cardioverter/defibrillator) present 04/13/2015   icd   . Atrial fibrillation ([place])   . CAD (coronary artery disease), native coronary artery 03/10/2014   02/28/10 AVR with tissue valve, Cabg with LIMA to LAD, SVG to int, and SVG to RCA Dr. Roxan Hockey  Cath 02/16/2010 Calcified coronaries  LM calcified, LAD, diffuse disease, circ calcified with 60% mid, RCA calcified with 80% prox and 50% mid  36 mm Peak gradient, severe aortic stenosis  2003 Cypher stent in distal RCA , PTCA of diagonal 1996 PTCA of RCA in 2 places with restenosis treated again   . Cancer ([place]) 2007   Lung  . CHF (congestive heart failure) ([place])   . Chronic diastolic heart failure ([place])   . Chronic dyspnea   . COPD   . Diabetes mellitus type 2, noninsulin dependent ([place]) 03/10/2014  . GERD 08/06/2008  . History of lung cancer 12/13/2005   LUL lobe Stage 1 non small cell cancer treated with resection Dr. [name]   . Hyperlipidemia   . Hypertension   . Hypertensive heart disease   . Kidney stones 05/03/09   HEMATURIA,CYSTOSCOPY  DONE  . LBBB (left bundle branch block)   . Lumbar disc disease       Past Surgical History:  Procedure Laterality Date  . CATARACT EXTRACTION Right   . COLONOSCOPY  2008   NORMAL  . CORONARY ARTERY BYPASS GRAFT  02/2010  . EP IMPLANTABLE DEVICE N/A 04/13/2015   Procedure: BiV ICD Insertion CRT-D;  Surgeon: [name], MD;  Location: [facility];  Service: Cardiovascular;  Laterality: N/A;  . LUL RESECTION  12/2005   DR, [name]  Lung Surgery  . TISSUE AORTIC VALVE REPLACEMENT  02/2010  . TONSILLECTOMY    . VIDEO BRONCHOSCOPY Bilateral 08/09/2015   Procedure: VIDEO BRONCHOSCOPY WITH FLUORO;  Surgeon: [name], MD;  Location: [facility];  Service: Cardiopulmonary;  Laterality: Bilateral;     Allergies:  is allergic to ciprocin-fluocin-procin [fluocinolone]; clarithromycin; and hydrocodone-acetaminophen.   Medications: Prior to Admission medications   Medication Sig Start Date End Date Taking? Authorizing Provider  albuterol (PROVENTIL) (2.5 MG/3ML) 0.083% nebulizer solution Take 2.5 mg by nebulization every 6 (six) hours as needed for wheezing or shortness of breath.  08/03/13   Historical Provider, MD  aspirin 81 MG tablet Take 81 mg by mouth daily.     Historical Provider, MD  BREO ELLIPTA 200-25 MCG/INH AEPB Inhale 1 capsule into the lungs daily. 12/06/15   Historical Provider, MD  carvedilol (COREG) 6.25 MG tablet Take by mouth. 02/02/16 02/01/17  Historical Provider, MD  finasteride (PROSCAR) 5 MG tablet Take 1 tablet by mouth every morning. 07/20/15   Historical Provider, MD  furosemide (LASIX) 40 MG tablet Take 40 mg by mouth daily.  02/21/15   Historical Provider, MD  GLOBAL EASE INJECT PEN NEEDLES 31G X 5 MM MISC  11/28/15   Historical Provider, MD  levofloxacin (LEVAQUIN) 500 MG tablet  01/27/16   Historical Provider, MD  losartan (COZAAR) 25 MG tablet Take by mouth. 02/02/16 02/01/17  Historical Provider, MD  omeprazole (PRILOSEC) 40 MG capsule Take 40 mg by mouth daily.   12/10/14   Historical Provider, MD  potassium chloride (K-DUR,KLOR-CON) 10 MEQ tablet Take 10 mEq by mouth every other day.  02/14/15   Historical Provider, MD  predniSONE (DELTASONE) 10 MG tablet TAKE 1-2 TABLETS DAILY WITH BREAKFAST 12/05/15   [name], MD  SPIRIVA HANDIHALER 18 MCG inhalation capsule INHALE THE CONTENTS OF 1 CAPSULE EVERY DAY 05/10/16   [name], MD    Family History: Family Status  Relation Status  . Father Deceased at age 78   died of heart disease  . Mother Deceased at age 70   died of dementia and old age  . Brother Deceased   CAD/rior CABG  . Daughter     Social History:   reports  that he quit smoking about 10 years ago. His smoking use included Cigarettes. He has a 50.00 pack-year smoking history. He has never used smokeless tobacco. He reports that he does not drink alcohol or use drugs.   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  No pulses are spontaneously present in the extremities a faint carotid pulses palpated.  Heart sounds are distant, lung sounds were reduced, abdomen is mildly distended, no edema is noted, neurologic is unable to be assessed.  Labs: CBC  Recent Labs  08/28/2016 0419 08-28-16 0435  WBC 14.8*  --   RBC 1.99*  --   HGB 6.6* 6.8*  HCT 20.4* 20.0*  PLT 150  --   MCV 102.5*  --   MCH 33.2  --   MCHC 32.4  --   RDW 14.9  --    CMP   Recent Labs  08/28/2016 0419 2016-08-28 0435  NA 138 139  K 4.3 4.4  CL 106 104  CO2 15*  --   GLUCOSE 545* 524*  BUN 32* 44*  CREATININE 1.94* 1.80*  CALCIUM 7.6*  --   PROT 4.1*  --   ALBUMIN 2.4*  --   AST 41  --   ALT 17  --   ALKPHOS 35*  --   BILITOT 0.6  --   GFRNONAA 32*   --   GFRAA 37*  --   Cardiac Panel (last 3 results) Troponin (Point of Care Test)  Recent Labs  08-28-16 0433  TROPIPOC 0.06   Cardiac Panel (last 3 results)  Recent Labs  08/28/16 0419  TROPONINI 0.08*     Radiology:  X-ray shows fibrosis  EKG: Initial EKG showed sinus rhythm with paced rhythm, a subsequent EKG done showed inferior ST elevation   Greater than 60 minutes of critical care time was involved in taking care of the patient.  Signed:  Kerry Hough MD [facility]   Cardiology Consultant  08/28/2016, 5:29 AM

## 2016-08-12 NOTE — ED Notes (Signed)
Patient transported emergently to OR for attempted repair of ruptured abdominal aneurism.

## 2016-08-12 NOTE — ED Provider Notes (Addendum)
By signing my name below, I, Hansel Feinstein, attest that this documentation has been prepared under the direction and in the presence of [place], DO. Electronically Signed: Hansel Feinstein, ED Scribe. 19-Aug-2016. 4:51 AM.   TIME SEEN: 3:50 AM   CHIEF COMPLAINT:  Chief Complaint  Patient presents with  . Cardiac Arrest     LEVEL 5 CAVEAT: HPI and ROS limited due to pt condition    HPI:  HPI Comments: Kenneth [name] is a 80 y.o. male brought in by ambulance with h/o abdominal aortic Aneurysm (last 5.4 cm followed by Dr. Donnetta Hutching), tissue AVR, NICM s/p defib/AICD, Afib, CAD s/p CABG, CHF, COPD, DM2, HLD, HTN who presents to the Emergency Department for evaluation of possible cardiac arrest. EMS was initially called for severe CP and upper abdominal pain with radiation to the back. They note that on their arrival, pt was alert and talking.  In route, pt began complaining of shortness of breath; pt placed on supplemental O2 with EMS. Per EMS, the pt became unresponsive with agonal respirations en route. EMS states his CBG was 260 en route. Per EMS, 1 IV was placed, 500 cc fluids and 2 DuoNeb tx were given en route. On pts arrival to ED, he had no radial, femoral or carotid pulse and CPR was initiated.  No documented anticoagulation.  Pt is on ASA per his chart.   ROS: Unable to perform ROS: due to patient condition    PAST MEDICAL HISTORY/PAST SURGICAL HISTORY:  Past Medical History:  Diagnosis Date  . Abdominal aortic aneurysm without mention of rupture   . AICD (automatic cardioverter/defibrillator) present 04/13/2015   icd   . Atrial fibrillation ([place])   . CAD (coronary artery disease), native coronary artery 03/10/2014   02/28/10 AVR with tissue valve, Cabg with LIMA to LAD, SVG to int, and SVG to RCA Dr. Roxan Hockey  Cath 02/16/2010 Calcified coronaries  LM calcified, LAD, diffuse disease, circ calcified with 60% mid, RCA calcified with 80% prox and 50% mid  36 mm Peak gradient, severe aortic  stenosis  2003 Cypher stent in distal RCA , PTCA of diagonal 1996 PTCA of RCA in 2 places with restenosis treated again   . Cancer ([place]) 2007   Lung  . CHF (congestive heart failure) ([place])   . Chronic diastolic heart failure ([place])   . Chronic dyspnea   . COPD   . Diabetes mellitus type 2, noninsulin dependent ([place]) 03/10/2014  . GERD 08/06/2008  . History of lung cancer 12/13/2005   LUL lobe Stage 1 non small cell cancer treated with resection Dr. [name]   . Hyperlipidemia   . Hypertension   . Hypertensive heart disease   . Kidney stones 05/03/09   HEMATURIA,CYSTOSCOPY DONE  . LBBB (left bundle branch block)   . Lumbar disc disease     MEDICATIONS:  Prior to Admission medications   Medication Sig Start Date End Date Taking? Authorizing Provider  albuterol (PROVENTIL) (2.5 MG/3ML) 0.083% nebulizer solution Take 2.5 mg by nebulization every 6 (six) hours as needed for wheezing or shortness of breath.  08/03/13   Historical Provider, MD  aspirin 81 MG tablet Take 81 mg by mouth daily.     Historical Provider, MD  BREO ELLIPTA 200-25 MCG/INH AEPB Inhale 1 capsule into the lungs daily. 12/06/15   Historical Provider, MD  carvedilol (COREG) 6.25 MG tablet Take by mouth. 02/02/16 02/01/17  Historical Provider, MD  finasteride (PROSCAR) 5 MG tablet Take 1 tablet by mouth  every morning. 07/20/15   Historical Provider, MD  furosemide (LASIX) 40 MG tablet Take 40 mg by mouth daily.  02/21/15   Historical Provider, MD  GLOBAL EASE INJECT PEN NEEDLES 31G X 5 MM MISC  11/28/15   Historical Provider, MD  levofloxacin (LEVAQUIN) 500 MG tablet  01/27/16   Historical Provider, MD  losartan (COZAAR) 25 MG tablet Take by mouth. 02/02/16 02/01/17  Historical Provider, MD  omeprazole (PRILOSEC) 40 MG capsule Take 40 mg by mouth daily.  12/10/14   Historical Provider, MD  potassium chloride (K-DUR,KLOR-CON) 10 MEQ tablet Take 10 mEq by mouth every other day.  02/14/15   Historical Provider, MD  predniSONE (DELTASONE) 10 MG  tablet TAKE 1-2 TABLETS DAILY WITH BREAKFAST 12/05/15   [name], MD  SPIRIVA HANDIHALER 18 MCG inhalation capsule INHALE THE CONTENTS OF 1 CAPSULE EVERY DAY 05/10/16   [name], MD    ALLERGIES:  Allergies  Allergen Reactions  . Ciprocin-Fluocin-Procin [Fluocinolone] Nausea Only  . Clarithromycin Nausea Only    Altered taste  . Hydrocodone-Acetaminophen Nausea Only    SOCIAL HISTORY:  Social History  Substance Use Topics  . Smoking status: Former Smoker    Packs/day: 1.00    Years: 50.00    Types: Cigarettes    Quit date: 12/13/2005  . Smokeless tobacco: Never Used     Comment: remotely quit smoking  . Alcohol use No    FAMILY HISTORY: Family History  Problem Relation Age of Onset  . Heart disease Father     MI/CABG  . Hyperlipidemia Father   . Hypertension Father   . Heart attack Father     Several attacks  . Diabetes Father   . Cancer Mother     Breast  . Dementia Mother   . Heart disease Brother     Before age 74  . Hyperlipidemia Brother   . Hypertension Brother   . Heart attack Brother   . Peripheral vascular disease Brother   . Diabetes Daughter     EXAM: BP (!) 66/45   Pulse (!) 159   Resp 13   SpO2 (!) 70%  CONSTITUTIONAL: GCS 3, elderly, chronically ill-appearing HEAD: Normocephalic; atraumatic EYES:  3-4 mm bilaterally and sluggishly reactive ENT: normal nose; no rhinorrhea; moist mucous membranes; pharynx without lesions noted; no dental injury; no septal hematoma NECK: Supple, no meningismus, no LAD; no midline spinal tenderness, step-off or deformity CARD: Patient is pulseless upon arrival RESP: Agonal respirations, decreased breath sounds bilaterally with bagging CHEST:  chest wall stable ABD/GI: Decreased bowel sounds, nondistended abdomen EXT: Mottled extremities, extremity or cool to palpation and I am unable to appreciate radial, DP or femoral pulses SKIN: Mottled cool skin NEURO: GCS 3   MEDICAL DECISION MAKING: Patient here  with initial complaints of chest pain and shortness of breath. Was pulseless upon arrival with EMS and CPR started in the emergency department. Patient given epinephrine and bicarbonate prior to being hooked to the monitor. On recheck of his pulse he had return of spontaneous circulation and appeared to be in a sinus tachycardia (rate 140s). Initial EKG appeared to show ST elevations in V1 and aVR with reciprocal depression versus a new left bundle branch block. Given complaint of chest pain and shortness of breath, code STEMI was initiated. He did have intermittent episodes of ventricular tachycardia (wide-complex tachycardia with a rate in the 170s). These episodes would completely resolve before defibrillation could be performed. He did receive 300 mg of amiodarone  and these episodes stopped.   He then had a second round of cardiac arrest again resolved quickly after epinephrine and bicarbonate. Was found to be in PEA at that time.  Dr. Irish Lack with cardiology was consult and case discussed. He did not feel that this was a STEMI and the code was canceled. Dr. Wynonia Lawman who was patient's cardiologist was at bedside quickly and we appreciate cardiology's help. Patient had another EKG at that time which did show an inferior STEMI. Given however his case was very complicated by the fact that he was so unstable and had a history of a AAA, we decided to take the patient to CT scan when he was finally more stable/had ROSC to evaluate his aorta.  Bedside ultrasound did show that he had an abdominal aneurysm but there was not any free fluid seen at that time. He had no pericardial effusion and poor ejection fraction.   Pt was hypotensive. Was getting IV fluids.  Levophed was also started.  Chem 8 showed a hemoglobin of 6.8, lactate of 12. He had a metabolic acidosis likely secondary to elevated lactate. Had received several rounds of bicarbonate. Emergent blood 2 units started.  Pt not on anticoagulation per chart.   INR normal.   While in CT scan, patient had another episode of cardiac arrest which again improved after epinephrine and bicarbonate. CT scan confirmed ruptured AAA with active extravasation. Patient was receiving blood while in the CT scan. Immediately Dr. Oneida Alar with vascular surgery was contacted. Patient had been followed previously by Dr. Donnetta Hutching. Family had been updated by myself and Dr. Wynonia Lawman. They state they wanted patient to be a full code and wanted surgical intervention if possible. They were aware of patient's likely poor outcome.   Patient had several more rounds of cardiac arrest. His blood pressure would drop and he would go into PEA. He improved quickly every time with epinephrine and bicarbonate. Levophed maxed out and Epi drip ordered.  Patient had received at this time a total of 4 L of IV fluids and another 2 units of emergent blood were getting prepped. Vascular surgery, Dr. Oneida Alar, at bedside to take patient emergently to the operating room.       Cardiopulmonary Resuscitation (CPR) Procedure Note  Directed/Performed by: Delice Bison Lodie Waheed, DO  I personally directed ancillary staff and/or performed CPR in an effort to regain return of spontaneous circulation and to maintain cardiac, neuro and systemic perfusion.    INTUBATION Performed by: Nyra Jabs  Required items: required blood products, implants, devices, and special equipment available Patient identity confirmed: provided demographic data and hospital-assigned identification number Time out: Immediately prior to procedure a "time out" was called to verify the correct patient, procedure, equipment, support staff and site/side marked as required.  Indications: Cardiac arrest   Intubation method: Glidescope Laryngoscopy   Preoxygenation: BVM  Patient still had some agonal respiration and some spontaneous movements. Sedatives: 30 mg IV Etomidate Paralytic: 100 mg IV Succinylcholine  Tube Size: 7.5  cuffed  Post-procedure assessment: chest rise and ETCO2 monitor Breath sounds: equal and absent over the epigastrium Tube secured with: ETT holder Chest x-ray interpreted by radiologist and me.  Chest x-ray findings: endotracheal tube in appropriate position  Patient tolerated the procedure well with no immediate complications.        EMERGENCY DEPARTMENT [place] CARDIAC EXAM "Study: Limited Ultrasound of the heart and pericardium"  INDICATIONS:Cardiac arrest Multiple views of the heart and pericardium were obtained in real-time with a multi-frequency  probe.  PERFORMED [id number]  IMAGES ARCHIVED?: Yes  FINDINGS: No pericardial effusion Ejection fraction of 20% or less.    LIMITATIONS:  Emergent procedure  VIEWS USED: Parasternal long axis  INTERPRETATION: Cardiac activity present   EMERGENCY DEPARTMENT ULTRASOUND  Study: Limited Retroperitoneal Ultrasound of the Abdominal Aorta.  INDICATIONS:Unstable Vital Signs Multiple views of the abdominal aorta were obtained in real-time from the diaphragmatic hiatus to the aortic bifurcation in transverse planes with a multi-frequency probe. PERFORMED BY: Myself IMAGES ARCHIVED?: Yes FINDINGS: Free fluid absent LIMITATIONS:  Body habitus INTERPRETATION:  Abdominal aortic aneurysm present   CPT Code: [id number] (limited retroperitoneal)         EKG Interpretation  Date/Time:  12-Aug-2016 04:02:33 EDT Ventricular Rate:  141 PR Interval:    QRS Duration: 135 QT Interval:  329 QTC Calculation: 504 R Axis:   80 Text Interpretation:  Sinus tachycardia Probable left atrial enlargement Left bundle branch block Confirmed by Sanaiya Welliver,  DO, Elmyra Banwart [phone]) on 12-Aug-2016 7:07:01 AM         EKG Interpretation  Date/Time:  Monday 12-Aug-2016 04:07:37 EDT Ventricular Rate:  129 PR Interval:    QRS Duration: 187 QT Interval:  415 QTC Calculation: 609 R Axis:   147 Text Interpretation:  Sinus tachycardia  Ventricular premature complex Nonspecific intraventricular conduction delay Repol abnrm suggests ischemia, diffuse leads Confirmed by Kathlyne Loud,  DO, Aedon Deason [phone]) on 08-12-16 7:07:54 AM        EKG Interpretation  Date/Time:  08-12-2016 04:12:55 EDT Ventricular Rate:  105 PR Interval:    QRS Duration: 117 QT Interval:  371 QTC Calculation: 491 R Axis:   122 Text Interpretation:  Sinus tachycardia Probable left atrial enlargement Nonspecific intraventricular conduction delay Abnormal lateral Q waves Anterior infarct, old Borderline ST depression, lateral leads Confirmed by Jontay Maston,  DO, Colsen Modi [phone]) on 08/12/2016 7:08:35 AM        EKG Interpretation  Date/Time:  2016-08-12 04:25:55 EDT Ventricular Rate:  81 PR Interval:    QRS Duration: 130 QT Interval:  426 QTC Calculation: 495 R Axis:   134 Text Interpretation:  Sinus rhythm Borderline short PR interval Right bundle branch block Anterolateral infarct, age indeterminate Confirmed by Adelfa Lozito,  DO, Darlina Mccaughey ([id number]) on 08/12/16 7:09:04 AM         EKG Interpretation  Date/Time:  August 12, 2016 04:37:29 EDT Ventricular Rate:  110 PR Interval:    QRS Duration: 157 QT Interval:  416 QTC Calculation: 563 R Axis:   133 Text Interpretation:  Ectopic atrial tachycardia, unifocal Nonspecific intraventricular conduction delay Inferior infarct, acute (RCA) Anterior infarct, old Probable RV involvement, suggest recording right precordial leads Confirmed by Alekhya Gravlin,  DO, Rudene Poulsen [phone]) on August 12, 2016 7:10:08 AM        EKG Interpretation  Date/Time:  08-12-2016 05:35:21 EDT Ventricular Rate:  62 PR Interval:    QRS Duration: 154 QT Interval:  523 QTC Calculation: 532 R Axis:   132 Text Interpretation:  Atrial fibrillation Right bundle branch block Abnormal lateral Q waves Anterior infarct, old Baseline wander in lead(s) V4 V6 Confirmed by Vali Capano,  DO, Tirrell Buchberger [id number]) on Aug 12, 2016 7:11:21  AM            CRITICAL CARE Performed by: Nyra Jabs   Total critical care time: 90 minutes  Critical care time was exclusive of separately billable procedures and treating other patients.  Critical care was necessary to treat or prevent  imminent or life-threatening deterioration.  Critical care was time spent personally by me on the following activities: development of treatment plan with patient and/or surrogate as well as nursing, discussions with consultants, evaluation of patient's response to treatment, examination of patient, obtaining history from patient or surrogate, ordering and performing treatments and interventions, ordering and review of laboratory studies, ordering and review of radiographic studies, pulse oximetry and re-evaluation of patient's condition.     I personally performed the services described in this documentation, which was scribed in my presence. The recorded information has been reviewed and is accurate.     [place], DO 2016-08-20 [place], DO August 20, 2016 [phone]

## 2016-08-12 NOTE — Op Note (Signed)
Procedure: Insertion of right femoral A-line  Preoperative diagnosis: Ruptured abdominal aortic aneurysm. Her postoperative diagnosis: Same  Anesthesia: Gen.  Operative details: Patient with known ruptured abdominal aortic aneurysm was brought to operating room 16. Patient was unstable prior to and during arrival to the operating room. He was hypotensive with occasional loss of pulse. After transferring the patient to the operating room table. Sterile prep and drape was performed. Ultrasound was used in attempt to cannulate the right common femoral artery to place a Coda balloon. During this portion of the procedure the patient became asystolic and we could not recover a pulse or blood pressure. At this point interventions were ceased and the patient was pronounced dead at 6:30 AM.  Ruta Hinds, MD [facility] Office: [phone] Pager: [phone]

## 2016-08-12 NOTE — Anesthesia Preprocedure Evaluation (Addendum)
Anesthesia Evaluation  Patient identified by MRN, date of birth, ID band Patient awake and Patient unresponsive    Reviewed: Unable to perform ROS - Chart review onlyPreop documentation limited or incomplete due to emergent nature of procedure.  Airway Mallampati: Intubated       Dental  (+) Dental Advisory Given   Pulmonary COPD, former smoker,     + decreased breath sounds      Cardiovascular hypertension, + CAD   Rhythm:Irregular Rate:Abnormal  Ruptured aortic aneurysm   Neuro/Psych negative neurological ROS     GI/Hepatic negative GI ROS, Neg liver ROS,   Endo/Other  diabetes  Renal/GU Renal InsufficiencyRenal disease     Musculoskeletal negative musculoskeletal ROS (+)   Abdominal   Peds  Hematology  (+) Blood dyscrasia, anemia ,   Anesthesia Other Findings Day of surgery medications reviewed with the patient.  Reproductive/Obstetrics                            Anesthesia Physical Anesthesia Plan  ASA: V and emergent  Anesthesia Plan:    Post-op Pain Management:    Induction: Inhalational  Airway Management Planned: Oral ETT  Additional Equipment: CVP, Ultrasound Guidance Line Placement and Arterial line  Intra-op Plan:   Post-operative Plan:   Informed Consent:   History available from chart only and Only emergency history available  Plan Discussed with: CRNA, Anesthesiologist and Surgeon  Anesthesia Plan Comments: (Presented emergently to OR with ACLS in progress. Pre-op eval completed after surgery.)       Anesthesia Quick Evaluation

## 2016-08-12 NOTE — Progress Notes (Signed)
Pt. Transported to PACU rm. 15 for family viewing.

## 2016-08-12 NOTE — Anesthesia Postprocedure Evaluation (Signed)
Anesthesia Post Note  Patient: Kenneth [name]  Procedure(s) Performed: Procedure(s) (LRB): ANEURYSM ABDOMINAL AORTIC REPAIR (N/A)  Anesthesia Post Evaluation  Last Vitals:  Vitals:   Aug 01, 2016 0542 08-01-16 0544  BP: 122/67 (!) 66/45  Pulse: (!) 159   Resp: (!) 36 13    Last Pain: There were no vitals filed for this visit.         Time of death Butts

## 2016-08-12 NOTE — ED Notes (Signed)
Pulses absent. CPR started.

## 2016-08-12 NOTE — Code Documentation (Signed)
ROSC: ST @ 102 w/ BP @ 52/41

## 2016-08-12 NOTE — Code Documentation (Signed)
Return from CT

## 2016-08-12 DEATH — deceased
